# Patient Record
Sex: Female | Born: 1960
Health system: Southern US, Community
[De-identification: ages and names within clinical notes are randomized; demographics above are authoritative.]

## PROBLEM LIST (undated history)

## (undated) DIAGNOSIS — K5090 Crohn's disease, unspecified, without complications: Secondary | ICD-10-CM

## (undated) DIAGNOSIS — K219 Gastro-esophageal reflux disease without esophagitis: Secondary | ICD-10-CM

## (undated) DIAGNOSIS — E785 Hyperlipidemia, unspecified: Secondary | ICD-10-CM

## (undated) DIAGNOSIS — D689 Coagulation defect, unspecified: Secondary | ICD-10-CM

## (undated) DIAGNOSIS — T7840XA Allergy, unspecified, initial encounter: Secondary | ICD-10-CM

## (undated) DIAGNOSIS — F32A Depression, unspecified: Secondary | ICD-10-CM

## (undated) DIAGNOSIS — F419 Anxiety disorder, unspecified: Secondary | ICD-10-CM

## (undated) DIAGNOSIS — E119 Type 2 diabetes mellitus without complications: Secondary | ICD-10-CM

## (undated) DIAGNOSIS — F329 Major depressive disorder, single episode, unspecified: Secondary | ICD-10-CM

## (undated) DIAGNOSIS — K5010 Crohn's disease of large intestine without complications: Secondary | ICD-10-CM

## (undated) DIAGNOSIS — I10 Essential (primary) hypertension: Secondary | ICD-10-CM

## (undated) DIAGNOSIS — K589 Irritable bowel syndrome without diarrhea: Secondary | ICD-10-CM

## (undated) DIAGNOSIS — N739 Female pelvic inflammatory disease, unspecified: Secondary | ICD-10-CM

## (undated) DIAGNOSIS — D649 Anemia, unspecified: Secondary | ICD-10-CM

## (undated) HISTORY — DX: Type 2 diabetes mellitus without complications: E11.9

## (undated) HISTORY — DX: Crohn's disease, unspecified, without complications: K50.90

## (undated) HISTORY — PX: TUBAL LIGATION: SHX77

## (undated) HISTORY — DX: Essential (primary) hypertension: I10

## (undated) HISTORY — DX: Major depressive disorder, single episode, unspecified: F32.9

## (undated) HISTORY — DX: Allergy, unspecified, initial encounter: T78.40XA

## (undated) HISTORY — DX: Anemia, unspecified: D64.9

## (undated) HISTORY — DX: Irritable bowel syndrome, unspecified: K58.9

## (undated) HISTORY — DX: Anxiety disorder, unspecified: F41.9

## (undated) HISTORY — DX: Hyperlipidemia, unspecified: E78.5

## (undated) HISTORY — PX: EYE SURGERY: SHX253

## (undated) HISTORY — DX: Female pelvic inflammatory disease, unspecified: N73.9

## (undated) HISTORY — DX: Gastro-esophageal reflux disease without esophagitis: K21.9

## (undated) HISTORY — DX: Depression, unspecified: F32.A

## (undated) HISTORY — DX: Crohn's disease of large intestine without complications: K50.10

## (undated) HISTORY — DX: Coagulation defect, unspecified: D68.9

---

## 1987-08-24 DIAGNOSIS — N739 Female pelvic inflammatory disease, unspecified: Secondary | ICD-10-CM

## 1987-08-24 HISTORY — DX: Female pelvic inflammatory disease, unspecified: N73.9

## 1990-08-23 HISTORY — PX: TONSILLECTOMY: SHX5217

## 1996-08-23 HISTORY — PX: NASAL SINUS SURGERY: SHX719

## 1998-05-20 ENCOUNTER — Other Ambulatory Visit: Admission: RE | Admit: 1998-05-20 | Discharge: 1998-05-20 | Payer: Self-pay | Admitting: Obstetrics and Gynecology

## 1999-05-19 ENCOUNTER — Other Ambulatory Visit: Admission: RE | Admit: 1999-05-19 | Discharge: 1999-05-19 | Payer: Self-pay | Admitting: Obstetrics and Gynecology

## 1999-05-29 ENCOUNTER — Encounter: Payer: Self-pay | Admitting: Obstetrics and Gynecology

## 1999-05-29 ENCOUNTER — Ambulatory Visit (HOSPITAL_COMMUNITY): Admission: RE | Admit: 1999-05-29 | Discharge: 1999-05-29 | Payer: Self-pay | Admitting: Obstetrics and Gynecology

## 2000-05-25 ENCOUNTER — Other Ambulatory Visit: Admission: RE | Admit: 2000-05-25 | Discharge: 2000-05-25 | Payer: Self-pay | Admitting: Obstetrics and Gynecology

## 2001-03-06 ENCOUNTER — Encounter: Payer: Self-pay | Admitting: Obstetrics and Gynecology

## 2001-03-06 ENCOUNTER — Ambulatory Visit (HOSPITAL_COMMUNITY): Admission: RE | Admit: 2001-03-06 | Discharge: 2001-03-06 | Payer: Self-pay | Admitting: Obstetrics and Gynecology

## 2001-06-14 ENCOUNTER — Other Ambulatory Visit: Admission: RE | Admit: 2001-06-14 | Discharge: 2001-06-14 | Payer: Self-pay | Admitting: Obstetrics and Gynecology

## 2002-06-22 ENCOUNTER — Ambulatory Visit (HOSPITAL_COMMUNITY): Admission: RE | Admit: 2002-06-22 | Discharge: 2002-06-22 | Payer: Self-pay | Admitting: Obstetrics and Gynecology

## 2002-06-22 ENCOUNTER — Other Ambulatory Visit: Admission: RE | Admit: 2002-06-22 | Discharge: 2002-06-22 | Payer: Self-pay | Admitting: Obstetrics and Gynecology

## 2002-06-22 ENCOUNTER — Encounter: Payer: Self-pay | Admitting: Obstetrics and Gynecology

## 2002-06-23 HISTORY — PX: BREAST BIOPSY: SHX20

## 2002-06-26 ENCOUNTER — Encounter: Admission: RE | Admit: 2002-06-26 | Discharge: 2002-06-26 | Payer: Self-pay | Admitting: Obstetrics and Gynecology

## 2002-06-26 ENCOUNTER — Encounter (INDEPENDENT_AMBULATORY_CARE_PROVIDER_SITE_OTHER): Payer: Self-pay | Admitting: Specialist

## 2002-06-26 ENCOUNTER — Encounter: Payer: Self-pay | Admitting: Obstetrics and Gynecology

## 2002-08-23 HISTORY — PX: CAROTID ARTERY - SUBCLAVIAN ARTERY BYPASS GRAFT: SUR178

## 2003-06-24 ENCOUNTER — Ambulatory Visit (HOSPITAL_COMMUNITY): Admission: RE | Admit: 2003-06-24 | Discharge: 2003-06-24 | Payer: Self-pay | Admitting: Obstetrics and Gynecology

## 2003-06-24 ENCOUNTER — Other Ambulatory Visit: Admission: RE | Admit: 2003-06-24 | Discharge: 2003-06-24 | Payer: Self-pay | Admitting: Obstetrics and Gynecology

## 2003-12-20 ENCOUNTER — Encounter: Admission: RE | Admit: 2003-12-20 | Discharge: 2003-12-20 | Payer: Self-pay | Admitting: Cardiology

## 2004-01-01 ENCOUNTER — Ambulatory Visit (HOSPITAL_COMMUNITY): Admission: RE | Admit: 2004-01-01 | Discharge: 2004-01-02 | Payer: Self-pay | Admitting: Cardiology

## 2004-01-15 ENCOUNTER — Inpatient Hospital Stay (HOSPITAL_COMMUNITY): Admission: RE | Admit: 2004-01-15 | Discharge: 2004-01-16 | Payer: Self-pay | Admitting: *Deleted

## 2004-07-20 ENCOUNTER — Other Ambulatory Visit: Admission: RE | Admit: 2004-07-20 | Discharge: 2004-07-20 | Payer: Self-pay | Admitting: Obstetrics and Gynecology

## 2004-08-10 ENCOUNTER — Ambulatory Visit (HOSPITAL_COMMUNITY): Admission: RE | Admit: 2004-08-10 | Discharge: 2004-08-10 | Payer: Self-pay | Admitting: Obstetrics and Gynecology

## 2005-03-04 ENCOUNTER — Ambulatory Visit: Payer: Self-pay | Admitting: Cardiology

## 2005-07-21 ENCOUNTER — Other Ambulatory Visit: Admission: RE | Admit: 2005-07-21 | Discharge: 2005-07-21 | Payer: Self-pay | Admitting: Obstetrics and Gynecology

## 2005-09-07 ENCOUNTER — Ambulatory Visit: Admission: RE | Admit: 2005-09-07 | Discharge: 2005-09-07 | Payer: Self-pay | Admitting: Gynecologic Oncology

## 2005-10-20 ENCOUNTER — Ambulatory Visit (HOSPITAL_COMMUNITY): Admission: RE | Admit: 2005-10-20 | Discharge: 2005-10-20 | Payer: Self-pay | Admitting: Obstetrics and Gynecology

## 2005-11-08 ENCOUNTER — Encounter: Admission: RE | Admit: 2005-11-08 | Discharge: 2005-11-08 | Payer: Self-pay | Admitting: Obstetrics and Gynecology

## 2006-07-22 ENCOUNTER — Other Ambulatory Visit: Admission: RE | Admit: 2006-07-22 | Discharge: 2006-07-22 | Payer: Self-pay | Admitting: Obstetrics and Gynecology

## 2007-03-28 ENCOUNTER — Ambulatory Visit (HOSPITAL_COMMUNITY): Admission: RE | Admit: 2007-03-28 | Discharge: 2007-03-28 | Payer: Self-pay | Admitting: Obstetrics and Gynecology

## 2007-07-25 ENCOUNTER — Other Ambulatory Visit: Admission: RE | Admit: 2007-07-25 | Discharge: 2007-07-25 | Payer: Self-pay | Admitting: Obstetrics and Gynecology

## 2008-07-26 ENCOUNTER — Other Ambulatory Visit: Admission: RE | Admit: 2008-07-26 | Discharge: 2008-07-26 | Payer: Self-pay | Admitting: Obstetrics and Gynecology

## 2008-07-26 ENCOUNTER — Encounter: Admission: RE | Admit: 2008-07-26 | Discharge: 2008-07-26 | Payer: Self-pay | Admitting: Obstetrics and Gynecology

## 2009-08-01 ENCOUNTER — Ambulatory Visit (HOSPITAL_COMMUNITY): Admission: RE | Admit: 2009-08-01 | Discharge: 2009-08-01 | Payer: Self-pay | Admitting: Obstetrics and Gynecology

## 2011-01-08 NOTE — Discharge Summary (Signed)
NAMERUNELL, KOVICH                         ACCOUNT NO.:  [id number]   MEDICAL RECORD NO.:  [id number]                   PATIENT TYPE:  INP   LOCATION:  1829                                 FACILITY:  [facility]   PHYSICIAN:  Dorothea Glassman, M.D.                 DATE OF BIRTH:  May 05, 1961   DATE OF ADMISSION:  01/15/2004  DATE OF DISCHARGE:  01/16/2004                                 DISCHARGE SUMMARY   ADMISSION DIAGNOSIS:  Severe proximal right subclavian artery stenosis with  steal syndrome.   PAST MEDICAL HISTORY:  1. Hypertension.  2. Hyperlipidemia.  3. Gastroesophageal reflux disease.  4. History of tobacco use.   ALLERGIES:  The patient is allergic to QUINOLONE antibiotic.   DISCHARGE DIAGNOSIS:  Severe proximal right subclavian artery stenosis with  steal syndrome, status post right carotid subclavian bypass.   BRIEF HISTORY:  Ms. Desrosiers is a 50 year old female with symptoms of right  arm claudication and intermittent dizziness and presyncope.  She was  initially seen and evaluated by Dr. Sabino Snipes.  He performed  arteriography on Jan 01, 2004.  She was found to have a 90% right subclavian  stenosis that was not felt to be amenable to percutaneous intervention.  Consultation with vascular surgery was requested and she was evaluated by  Dr. Amedeo Plenty.  He reviewed the films in the hospital and then saw her in the  office on May 16.  After examination of the patient and review of all of the  medical records, Dr. Amedeo Plenty recommended proceeding with a right carotid  subclavian bypass.  The procedure, risks, and benefits were all discussed  with Ms. Sherryll Burger, and she agreed to proceed with surgery.   HOSPITAL COURSE:  On Jan 15, 2004 Ms. Weberg was electively admitted to  [facility] under the care of Dr. Gordy Clement.  She underwent  the following procedure, right carotid to subclavian bypass with 6-mm,  Dacron, Hemashield graft.  She tolerated the procedure well  transferring in  stable condition to the PACU.  She was extubated immediately following  surgery, awoke from anesthesia neurologically intact.  She was also noted to  have a 2+ right radial pulse.  She remained hemodynamically stable in the  immediate postoperative period.   On May 26, postoperative day 1 Ms. Madry reports feeling fairly well.  Her  vital signs are stable with blood pressure 130/55; she is afebrile; room air  saturation is 96% on room air.  Her heart is in sinus rhythm at  approximately 100 beats per minute.  She does have a few crackles in the  left base.  Her lungs are otherwise clear.  She has tolerated a regular diet  this morning without nausea.  She is voiding freely after removal of her  Foley catheter.  She has ambulated in the hallway with minimal assistance.  Smoking cessation has been discussed with  Ms. Ruotolo and she has also been  seen by the smoking cessation council.  She has been advised that if she is  experiencing withdrawal symptoms from cigarettes she may use nicotine  patches that are available over-the-counter.   LABORATORY STUDIES:  May 26:  CBC white blood cells 15.0, hemoglobin 9.5,  hematocrit 28.7, platelets 466.  Chemistries include a sodium at 135,  potassium 3.7, BUN 3, creatinine 0.6, glucose 108.   Ms. Roat is ready for discharge home on Jan 16, 2004.   CONDITION ON DISCHARGE:  Improved.   DISCHARGE MEDICATIONS:  She is to resume her home medications of  1. Vytorin 10/40 mg daily.  2. Aspirin 325 mg daily.  3. Nexium 40 mg daily.  4. Prozac 20 mg daily.  5. Zyrtec D 1 tab b.i.d.  6. Albuterol inhaler p.r.n.  7. For pain management she may have Tylox 1-2 p.o. q.4h. p.r.n.   INSTRUCTIONS ON DISCHARGE:  1. Activity:  She has been asked to refrain from any driving or any heavy     lifting at this time.  2. Diet:  She is to continue a heart healthy diet.  3. Wound Care:  She is to clean her incisions daily.  4. She may shower  beginning Friday, May 27.   FOLLOW UP:  Dr. Amedeo Plenty would like to see her back at the [facility] office on  Monday, June 13 at 11:40 in the morning.      Mardene Celeste, N.P.                  Dorothea Glassman, M.D.    CTK/MEDQ  D:  01/16/2004  T:  01/17/2004  Job:  [id number]   cc:   Dorothea Glassman, M.D.  [address]  [place]  [place] [address]   Ethelle Lyon, M.D. [facility]  [address] [place]  [place] [address]

## 2011-01-08 NOTE — Consult Note (Signed)
NAMEMORNINGSTAR, TOFT               ACCOUNT NO.:  [id number]   MEDICAL RECORD NO.:  [id number]          PATIENT TYPE:  OUT   LOCATION:  GYN                          FACILITY:  [facility]   PHYSICIAN:  Paola A. Alycia Rossetti, MD    DATE OF BIRTH:  June 20, 1961   DATE OF CONSULTATION:  09/07/2005  DATE OF DISCHARGE:                                   CONSULTATION   REQUESTING PHYSICIAN:  Dr. Lubertha South. Romine.   Ms. Thrush is a very pleasant 50 year old gravida 2, para 1-1-0-2 who has  menorrhagia.  Her cycles are regular but heavy.  Her last cycle was August 18, 2005.  She routinely sees Dr. Joan Flores for GYN care.  She went to Dr.  Joan Flores with the symptoms of menorrhagia and on August 11, 2005 underwent a  vaginal ultrasound.  It revealed the uterus to be 8.9 x 3.6 x 4.5 cm  retroverted with an endometrial stripe of 9.6 mm.  Within the adnexa she had  a 2.6 x 2.8 x 2.4 cm complex cyst with thick septation on the right adnexa  which overall measured 4.1 x 2.9 x 2.6 cm.  The left adnexa was  unremarkable.  There was no free fluid in the cul-de-sac.  Endometrial  biopsy was performed by Dr. Joan Flores which revealed a benign secretory  endometrium with no hyperplasia or carcinoma.  CA-125 was 12.  It is for  this reason that she comes in today.  She, otherwise, is completely  asymptomatic.  She occasionally suffers from some hot flashes at night.  This cyst has been an incidental finding.  She has no other symptoms of  pain.  She denies any change in bowel or bladder habits, any early satiety,  any bloating, any fevers or chills.   PAST MEDICAL HISTORY:  Significant for fibromuscular dysplasia.   PAST SURGICAL HISTORY:  Bypassed right subclavian in 2005, per her report,  otherwise, negative catheterizations.  She had sinus surgeries.  Cesarean  section for her twins 15 years ago.  Tubal ligation.   ALLERGIES:  None.   MEDICATIONS:  Zyrtec.  Lisinopril.  Vytorin.  Aspirin. Prozac.   SOCIAL HISTORY:   She denies history of alcohol.  She is divorced.  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   FAMILY HISTORY:  Significant for hypertension and hypercholesterolemia  through her family.  Her mother had colon cancer at the age of 20.   HEALTH MAINTENANCE:  She had a negative Pap smear July 21, 2005.  She is  due for a mammogram.   PHYSICAL EXAMINATION:  VITAL SIGNS:  Weight 162 pounds, height 5 feet 5  inches, blood pressure 140/90, pulse 80, respirations 18.  GENERAL:  Well-nourished, well-developed female in no acute distress.  NECK:  She has a well-healed surgical incision on the right side.  Neck is  supple.  There is no lymphadenopathy, no thyromegaly.  LUNGS:  Clear to auscultation bilaterally.  CARDIOVASCULAR:  Regular rate and rhythm.  ABDOMEN:  Notable for an infraumbilical incision.  Abdomen is soft,  nontender and nondistended.  There are no  palpable masses or  hepatosplenomegaly.  Groins are negative for adenopathy.  EXTREMITIES:  There is no edema.  PELVIC:  External genitalia is within normal limits.  Bimanual examination  the corpus is of normal size, shape and consistency.  It is retroverted.  There are no adnexal masses.   ASSESSMENT:  A 50 year old with a 2-cm complex right adnexal mass, normal CA-  125.  I discussed with her that the etiology of this could be multifaceted  including benign as well as neoplastic processes.  My suspicion is that this  represents a benign process; however, I cannot be 100% sure.  I think that  this may represent a collapsed ovarian cyst, and it would not be  unreasonable to follow this closely considering  her past medical history.  She is amenable to close followup.  We will ask her to get a followup  ultrasound in Dr. Julieta Bellini office in 6-8 weeks.  If the cyst is smaller at  that time conservative management for her menorrhagia can be proceeded under  the care of Dr. Joan Flores.  Should the mass be larger or there be other more   worrisome features, we may need to consider laparoscopic evaluation.  At  that time consideration could be given to endometrial ablation which is  something patient states she and Dr. Joan Flores have discussed for some time in  the past.   PLAN:  She will get an ultrasound in Dr. Julieta Bellini office in 6-8 weeks for  comparison.  We will ask Dr. Joan Flores to forward the results to [place] so that  disposition can be made.      Paola A. Alycia Rossetti, MD  Electronically Signed     PAG/MEDQ  D:  09/07/2005  T:  09/07/2005  Job:  [id number]   cc:   Lubertha South. Romine, M.D.  Fax: [phone]   Caswell Corwin, R.N.  [address], [place] [address]   Carlus Pavlov, MD  [place], [place]   Mingo Amber, MD   Marijo Conception. Wall, M.D.  [address] [place]  [place] [address]

## 2011-01-08 NOTE — Op Note (Signed)
[name], [name]                         ACCOUNT NO.:  [id number]   MEDICAL RECORD NO.:  [id number]                   PATIENT TYPE:  INP   LOCATION:  2330                                 FACILITY:  [facility]   PHYSICIAN:  Dorothea Glassman, M.D.                 DATE OF BIRTH:  April 27, 1961   DATE OF PROCEDURE:  01/15/2004  DATE OF DISCHARGE:                                 OPERATIVE REPORT   SURGEON:  Dorothea Glassman, M.D.   ASSISTANT:  Mardene Celeste, N.P.   ANESTHESIA:  General endotracheal anesthesia.   ANESTHESIOLOGIST:  Nelda Severe. Tobias Alexander, M.D.   PREOPERATIVE DIAGNOSIS:  Severe proximal right subclavian artery stenosis  with steal.   POSTOPERATIVE DIAGNOSIS:  Severe proximal right subclavian artery stenosis  with steal.   PROCEDURE:  Right carotid subclavian bypass with 6 mm Dacron Hemashield  graft.   CLINICAL NOTE:  Catherine Nicholson is a 50 year old female who developed symptoms  of right arm claudication and intermittent dizziness and presyncope.  She  was seen and evaluated by Dr. Sabino Snipes with arteriography indicative  of a severe proximal right subclavian artery stenosis.  She did have  subclavian steal.  This lesion was not felt to be amenable to percutaneous  treatment and, therefore, she is referred for right carotid subclavian  bypass for relief of symptoms.  The risks of this operative procedure with  the major morbidity and mortality of 1-2% to include but not limited to MI,  CVA, nerve injury, and death were discussed.   OPERATIVE PROCEDURE:  The patient was brought to the operating room in  stable condition.  She was placed in the supine position.  General  endotracheal anesthesia was induced.  A Foley catheter and arterial line  were placed.  The right neck was prepped and draped in a sterile fashion.  A  transverse skin incision was made at the base of the right neck and  supraclavicular fossa.  Dissection was carried deeply through the  subcutaneous  tissue.  The clavicular head of the sternocleidomastoid muscle  was divided.  The supraclavicular fat pad was mobilized and reflected  superiorly.  The right anterior jugular vein was ligated with 3-0 silk and  divided.  The right internal jugular vein was mobilized and reflected  medially.  The vagus nerve was freed and reflected posteriorly.  The  proximal right common carotid artery was freed and encircled with a vessel  loop.  This was free of significant disease.  Attention was turned to the  subclavian artery.  The phrenic nerve was mobilized and reflected medially.  The anterior scalene muscle was divided.  The subclavian artery was exposed  from the take off of the right internal mammary artery distally.  This was  encircled with a vessel loop and mobilized.  The patient was administered  7000 units of heparin intravenously.  The right common carotid artery was  controlled with clamps.  A longitudinal arteriotomy was made.  The 6 mm  graft was beveled and anastomosed end-to-side to the right common carotid  artery with running 6-0 Prolene suture.  Antegrade and retrograde flushing  of the carotid was carried out.  The clamp was removed from the carotid and  the clamp placed on the graft.  The graft was then tunneled anterior to the  vagus nerve and posterior to the jugular vein.  The right subclavian artery  was controlled proximally and distally with clamps.  A longitudinal  arteriotomy was made.  The graft was beveled and anastomosed end-to-side to  the right subclavian artery using a running 6-0 Prolene suture.  At the  completion of this, the graft and artery were flushed.  The clamps were  removed and excellent flow was present.  The patient was administered 50 mg  protamine intravenously.  Adequate hemostasis was obtained.  Sponge and  instrument counts were correct.  The wound was irrigated with saline  solution.  The clavicular head of the sternocleidomastoid muscle was   reapproximated with interrupted 2-0 Vicryl suture.  The platysma was closed  with running 3-0 Vicryl suture.  The skin was closed with 4-0 Monocryl.  Steri-Strips were applied.  There were no apparent complications.  Anesthesia was reversed.  The patient awakened readily.  2+ right radial  pulse present.  Neurologically intact.  She transferred to the recovery room  in stable condition, no apparent interoperative complications.                                               Dorothea Glassman, M.D.    PGH/MEDQ  D:  01/15/2004  T:  01/15/2004  Job:  [id number]   cc:   Ethelle Lyon, M.D. [facility]  [address] [place]  [place] [address]

## 2011-01-08 NOTE — Cardiovascular Report (Signed)
Catherine Nicholson, Catherine Nicholson                         ACCOUNT NO.:  [id number]   MEDICAL RECORD NO.:  [id number]                   PATIENT TYPE:  OIB   LOCATION:  5731                                 FACILITY:  [facility]   PHYSICIAN:  Ethelle Lyon, M.D. [facility]         DATE OF BIRTH:  1961/07/17   DATE OF PROCEDURE:  01/01/2004  DATE OF DISCHARGE:                              CARDIAC CATHETERIZATION   PROCEDURE:  Bilateral lower extremity angiography with runoff to the feet,  abdominal aortography, arch aortography, selective innominate angiography  via common femoral access, selective subclavian angiography also via right  brachial access.   INDICATION:  Catherine Nicholson is a 50 year old lady with risk factors of  hypertension and dyslipidemia and ongoing tobacco abuse as well as family  history of premature atherosclerotic disease.  She presents with two years  of progressive claudication in her right arm.  She also develops profound  lightheadedness with mild to moderate activity using her right arm.  She has  not had any frank syncope.  In addition, she complains of bilateral calf  cramping with walking approximately 1/2 mile.  She has a 50-mm differential  in systolic blood pressures from left to right arm with the right being  lower.  Ultrasound demonstrated right subclavian stenosis with retrograde  flow in the vertebral.  Flow in the left vertebral is antegrade.  She is  brought to the lab for angiography with an eye to percutaneous  revascularization.   PROCEDURAL TECHNIQUE:  Informed consent was obtained.  Under 1% lidocaine  local anesthesia, a 6 French sheath was placed in the right brachial artery  using the modified Seldinger technique.  Anticoagulation was initiated with  5000 units of heparin.  An end-hole catheter was advanced over a wire into  the right subclavian artery.  Angiography was performed by hand injection.  It was difficult to visualize the stenosis due to inability to  pass  sufficient contrast retrograde through the stenosis.  Therefore, access was  obtained in the right common femoral artery.  Using the modified Seldinger  technique, a 6 French sheath was placed in the right common femoral artery.  A pigtail catheter was advanced into the ascending aorta.  Arch angiography  was performed by power injection.  A 5 [place] headhunter catheter was then  used to selectively engage the innominate artery.  Angiography was performed  in multiple projections by hand injection.  These images demonstrated a 90%  stenosis of the right subclavian artery beginning flush with the distal  margin of the right carotid and with ulcerated plaque extending distal to  the takeoff of the vertebral.  Decision was made to consider surgical  revascularization.   Given her classic symptoms for claudication, abdominal aortography with  bilateral lower extremity runoff was then performed. The pigtail catheter  was reintroduced and positioned in the suprarenal abdominal aorta.  Aortography was performed by power injection.  The catheter was then  pulled  back to the infrarenal abdominal aorta.  Aortography and imaging of both  lower extremities by step table technique using digital subtraction was  performed.  Catheter was then removed.  She was transferred to the holding  room in stable condition.  Sheaths will be removed when the ACT is less than  175 seconds.   COMPLICATIONS:  None.   FINDINGS:  1. Normal aortic arch with normal origins of the great vessels.  2. 90% right subclavian stenosis extending from the distal margin of the     right carotid with ulcerated plaque extending through the take off of the     right vertebral artery which is large.  3. Normal abdominal aorta.  There are single renal arteries bilaterally.     The left renal is normal.  The right has fibromuscular dysplasia in its     mid segment.  4. Bilateral lower extremities are normal with three-vessel  runoff to the     feet bilaterally.   IMPRESSION/RECOMMENDATIONS:  1. 90% right subclavian stenosis.  There is no safe landing zone for     stenting.  I have therefore reviewed the films with Dr. Amedeo Plenty who will     plan on surgical bypass.  Plavix will be discontinued in anticipation of     this procedure.  2. Right renal artery fibromuscular dysplasia.  Will plan percutaneous     therapy after subclavian revascularization.  3. Normal lower extremity vascular system.                                               Ethelle Lyon, M.D. [facility]    WED/MEDQ  D:  01/01/2004  T:  01/02/2004  Job:  [id number]   cc:   Dorothea Glassman, M.D.  [address]  [place]  [place] [address]   Thomas C. Wall, M.D.   Dr. Carlus Pavlov in [place]

## 2011-01-08 NOTE — Discharge Summary (Signed)
Catherine Nicholson, Catherine Nicholson                         ACCOUNT NO.:  [id number]   MEDICAL RECORD NO.:  [id number]                   PATIENT TYPE:  OIB   LOCATION:                                       FACILITY:  [facility]   PHYSICIAN:  Ethelle Lyon, M.D. [facility]         DATE OF BIRTH:  04/30/1961   DATE OF ADMISSION:  01/01/2004  DATE OF DISCHARGE:  01/02/2004                                 DISCHARGE SUMMARY   BRIEF HISTORY:  This is a 50 year old female admitted to [facility]  on Jan 01, 2004 by Dr. Albertine Patricia for evaluation of a high-grade proximal right  subclavian artery stenosis, with subclavian steel symptoms. The patient has  multiple risk factors for vascular disease including positive family history  of early coronary artery disease, hyperlipidemia, tobacco use and obesity  (please see Admission History and Physical for full details).   PAST MEDICAL HISTORY:  1. Gastroesophageal reflux disease.  2. Hypertension.  3. Elevated lipids.  4. History of tobacco use.   ALLERGIES:  The patient is allergic to QUINOLONE ANTIBIOTICS.   SOCIAL HISTORY:  The patient smokes a pack a day. She does not drink  alcohol. She does drink a fair amount of caffeinated beverages. She tries to  walk approximately three times a week, 30 minutes a day.   HOSPITAL COURSE:  As noted, this patient was admitted to [facility]  for further evaluation of peripheral vascular disease. She did have an  angiogram performed on Jan 01, 2004 by Dr. Albertine Patricia. She was found to have a  90% right subclavian stenosis (please see his dictated report for full  details). Was not felt that this lesion was amenable to percutaneous  intervention, and Dr. Drucie Opitz was asked to review the films for possible  bypass surgery. It was felt that the patient would need an angioplasty of  her right renal artery at some point in the future.   Following the procedure, the patient did develop a right arm hematoma. She  is kept  overnight for observation.   The patient was discharged on the following day. She is to follow up in the  office with Dr. Drucie Opitz. The office will call her for an appointment. She  was taken off her Plavix for the impending surgery. She was discharged in  stable, improved condition.   LABORATORY DATA:  A chemistry profile revealed BUN of 3, creatinine 0.7,  potassium 3.9, glucose 104. There were no other labs performed.   DISCHARGE MEDICATIONS:  The patient was told to continue her home  medications except for her Plavix. Other medications included:  1. Aspirin 81 mg daily.  2. Zyrtec.  3. Prozac dosage unknown.  4. Vytorin 10/12 daily.  5. Albuterol inhalers.   The patient was told to take Tylenol as needed for pain. She was told to  avoid any strenuous activity or driving for two days. She was to  be on a low-  salt, low-fat diet. She was to call the office if she had any increased  pain, swelling or bleeding from her groin. She was told not to resume  smoking.   She was to follow up with Dr. Amedeo Plenty as instructed, Dr. Albertine Patricia on February 28, 2004 at 11 a.m. on Friday, Dr. Lysbeth Galas as needed or as scheduled. She will  see Dr. Verl Blalock, her primary cardiologist, following further evaluation by Dr.  Albertine Patricia.   PROBLEM LIST AT THE TIME OF DISCHARGE:  1. Right subclavian artery stenosis by angiogram, performed on Jan 01, 2004,     with postprocedural hematoma in the right upper extremity.  2. Right renal artery disease with planned angioplasty in the future.  3. History of tobacco use.  4. Peripheral vascular disease.  5. Gastroesophageal reflux disease.  6. Hypertension.  7. History of elevated lipids.      Mikey Bussing, P.A. [facility]                  Ethelle Lyon, M.D. [facility]    DR/MEDQ  D:  01/02/2004  T:  01/02/2004  Job:  [id number]   cc:   Carlus Pavlov, M.D.   Dorothea Glassman, M.D.  [address]  [place]  [place] [address]

## 2011-08-24 DIAGNOSIS — K5010 Crohn's disease of large intestine without complications: Secondary | ICD-10-CM

## 2011-08-24 HISTORY — DX: Crohn's disease of large intestine without complications: K50.10

## 2011-09-06 ENCOUNTER — Other Ambulatory Visit: Payer: Self-pay | Admitting: Obstetrics and Gynecology

## 2011-09-06 DIAGNOSIS — Z1231 Encounter for screening mammogram for malignant neoplasm of breast: Secondary | ICD-10-CM

## 2011-09-14 ENCOUNTER — Encounter: Payer: Self-pay | Admitting: Gastroenterology

## 2011-09-17 ENCOUNTER — Ambulatory Visit (INDEPENDENT_AMBULATORY_CARE_PROVIDER_SITE_OTHER): Payer: BC Managed Care – PPO | Admitting: Gastroenterology

## 2011-09-17 ENCOUNTER — Encounter: Payer: Self-pay | Admitting: Gastroenterology

## 2011-09-17 DIAGNOSIS — R143 Flatulence: Secondary | ICD-10-CM

## 2011-09-17 DIAGNOSIS — Z800 Family history of malignant neoplasm of digestive organs: Secondary | ICD-10-CM | POA: Insufficient documentation

## 2011-09-17 DIAGNOSIS — R141 Gas pain: Secondary | ICD-10-CM

## 2011-09-17 DIAGNOSIS — R140 Abdominal distension (gaseous): Secondary | ICD-10-CM

## 2011-09-17 DIAGNOSIS — D649 Anemia, unspecified: Secondary | ICD-10-CM

## 2011-09-17 MED ORDER — PEG-KCL-NACL-NASULF-NA ASC-C 100 G PO SOLR: 1.0000 | Freq: Once | ORAL | Status: DC

## 2011-09-17 NOTE — Progress Notes (Signed)
History of Present Illness: Ms. Catherine Nicholson is a 51 year old white female referred at the request of Dr. Alease Medina for colorectal cancer screening. Her mother developed colon cancer in her mid to late 57s. The patient has no GI complaints including change in bowel habits, abdominal pain, melena or hematochezia.    Past Medical History  Diagnosis Date  . Hypertension   . Hyperlipidemia    Past Surgical History  Procedure Date  . Carotid artery - subclavian artery bypass graft 2004  . Eye surgery     bi-lat lens transplant  . Tonsillectomy 1992  . Cesarean section 1991   family history includes Colon cancer in her mother; Heart disease in her father; and Hypertension in her father. Current Outpatient Prescriptions  Medication Sig Dispense Refill  . amitriptyline (ELAVIL) 25 MG tablet Take 25 mg by mouth at bedtime.      Marland Kitchen esomeprazole (NEXIUM) 40 MG capsule Take 40 mg by mouth daily before breakfast.      . fish oil-omega-3 fatty acids 1000 MG capsule Take 2 g by mouth 2 (two) times daily.      Marland Kitchen lisinopril (PRINIVIL,ZESTRIL) 40 MG tablet Take 40 mg by mouth daily.      . rosuvastatin (CRESTOR) 40 MG tablet Take 40 mg by mouth daily.       Allergies as of 09/17/2011  . (No Known Allergies)    reports that she quit smoking about 13 months ago. Her smoking use included Cigarettes. She quit after 30 years of use. She has never used smokeless tobacco. She reports that she does not drink alcohol or use illicit drugs.     Review of Systems: She complains of menorrhagia. Pertinent positive and negative review of systems were noted in the above HPI section. All other review of systems were otherwise negative.  Vital signs were reviewed in today's medical record Physical Exam: General: Well developed , well nourished, no acute distress Head: Normocephalic and atraumatic Eyes:  sclerae anicteric, EOMI Ears: Normal auditory acuity Mouth: No deformity or lesions Neck: Supple, no masses or  thyromegaly Lungs: Clear throughout to auscultation Heart: Regular rate and rhythm; no murmurs, rubs or bruits Abdomen: Soft, non tender and non distended. No masses, hepatosplenomegaly or hernias noted. Normal Bowel sounds Rectal:deferred Musculoskeletal: Symmetrical with no gross deformities  Skin: No lesions on visible extremities Pulses:  Normal pulses noted Extremities: No clubbing, cyanosis, edema or deformities noted Neurological: Alert oriented x 4, grossly nonfocal Cervical Nodes:  No significant cervical adenopathy Inguinal Nodes: No significant inguinal adenopathy Psychological:  Alert and cooperative. Normal mood and affect

## 2011-09-17 NOTE — Patient Instructions (Addendum)
You have been scheduled for a colonoscopy. Please follow written instructions given to you at your visit today.  Please pick up your prep kit at the pharmacy within the next 2-3 days.   We have sent the following medications to your pharmacy for you to pick up at your convenience: Moviprep Please follow the instructions you were given today at your appointment.

## 2011-09-17 NOTE — Assessment & Plan Note (Signed)
Plan screening colonoscopy

## 2011-09-20 ENCOUNTER — Encounter: Payer: Self-pay | Admitting: Internal Medicine

## 2011-09-23 ENCOUNTER — Other Ambulatory Visit (INDEPENDENT_AMBULATORY_CARE_PROVIDER_SITE_OTHER): Payer: BC Managed Care – PPO

## 2011-09-23 ENCOUNTER — Other Ambulatory Visit: Payer: Self-pay | Admitting: Gastroenterology

## 2011-09-23 ENCOUNTER — Encounter: Payer: Self-pay | Admitting: Gastroenterology

## 2011-09-23 ENCOUNTER — Ambulatory Visit (AMBULATORY_SURGERY_CENTER): Payer: BC Managed Care – PPO | Admitting: Gastroenterology

## 2011-09-23 DIAGNOSIS — D649 Anemia, unspecified: Secondary | ICD-10-CM

## 2011-09-23 DIAGNOSIS — Z800 Family history of malignant neoplasm of digestive organs: Secondary | ICD-10-CM

## 2011-09-23 DIAGNOSIS — K5289 Other specified noninfective gastroenteritis and colitis: Secondary | ICD-10-CM

## 2011-09-23 DIAGNOSIS — Z1211 Encounter for screening for malignant neoplasm of colon: Secondary | ICD-10-CM

## 2011-09-23 DIAGNOSIS — R143 Flatulence: Secondary | ICD-10-CM

## 2011-09-23 DIAGNOSIS — R141 Gas pain: Secondary | ICD-10-CM

## 2011-09-23 DIAGNOSIS — K529 Noninfective gastroenteritis and colitis, unspecified: Secondary | ICD-10-CM

## 2011-09-23 LAB — CBC WITH DIFFERENTIAL/PLATELET
Basophils Relative: 1.2 % (ref 0.0–3.0)
Eosinophils Absolute: 0.3 10*3/uL (ref 0.0–0.7)
Eosinophils Relative: 3.5 % (ref 0.0–5.0)
HCT: 34.5 % — ABNORMAL LOW (ref 36.0–46.0)
Hemoglobin: 11.5 g/dL — ABNORMAL LOW (ref 12.0–15.0)
Lymphocytes Relative: 24.4 % (ref 12.0–46.0)
MCV: 92.4 fl (ref 78.0–100.0)
Monocytes Relative: 7.8 % (ref 3.0–12.0)
Neutro Abs: 5.4 10*3/uL (ref 1.4–7.7)
Neutrophils Relative %: 63.1 % (ref 43.0–77.0)
RDW: 16 % — ABNORMAL HIGH (ref 11.5–14.6)

## 2011-09-23 LAB — COMPREHENSIVE METABOLIC PANEL
Albumin: 3.4 g/dL — ABNORMAL LOW (ref 3.5–5.2)
BUN: 7 mg/dL (ref 6–23)
Calcium: 8.9 mg/dL (ref 8.4–10.5)
Chloride: 107 mEq/L (ref 96–112)
Creatinine, Ser: 0.7 mg/dL (ref 0.4–1.2)
Glucose, Bld: 94 mg/dL (ref 70–99)
Potassium: 4.7 mEq/L (ref 3.5–5.1)

## 2011-09-23 LAB — HIGH SENSITIVITY CRP: CRP, High Sensitivity: 13.83 mg/L — ABNORMAL HIGH (ref 0.000–5.000)

## 2011-09-23 MED ORDER — HYDROCORTISONE ACETATE 25 MG RE SUPP: 25.0000 mg | Freq: Two times a day (BID) | RECTAL | Status: DC

## 2011-09-23 MED ORDER — MESALAMINE 1.2 G PO TBEC: 4.8000 g | DELAYED_RELEASE_TABLET | Freq: Every day | ORAL | Status: DC

## 2011-09-23 MED ORDER — SODIUM CHLORIDE 0.9 % IV SOLN: 500.0000 mL | INTRAVENOUS | Status: DC

## 2011-09-23 NOTE — Patient Instructions (Signed)
Resume medications.follow up with DR.Kaplan in 3 weeks. Begin lialda 4.8 gm and anusol HC supp. Labs to be drawn upon D/C. D/C Information reviewed with family. Information given on colitis/crohn's.

## 2011-09-23 NOTE — Progress Notes (Signed)
Patient did not experience any of the following events: a burn prior to discharge; a fall within the facility; wrong site/side/patient/procedure/implant event; or a hospital transfer or hospital admission upon discharge from the facility. (G8907) Patient did not have preoperative order for IV antibiotic SSI prophylaxis. (G8918)  

## 2011-09-23 NOTE — Op Note (Signed)
[facility] [address]. [place], [place]  [id number]  COLONOSCOPY PROCEDURE REPORT  PATIENT:  Catherine Nicholson, Catherine Nicholson  MR#:  [id number] BIRTHDATE:  [birth year], 57 yrs. old  GENDER:  female ENDOSCOPIST:  Sandy Salaam. Deatra Ina, MD REF. BY:  Elyse Hsu, M.D. PROCEDURE DATE:  09/23/2011 PROCEDURE:  Colonoscopy with biopsy ASA CLASS:  Class II INDICATIONS:  Elevated Risk Screening, family history of colon cancer mother MEDICATIONS:   MAC sedation, administered by CRNA propofol 229mIV  DESCRIPTION OF PROCEDURE:   After the risks benefits and alternatives of the procedure were thoroughly explained, informed consent was obtained.  Grade 3 internal hemorrhoids The LB [id number] was introduced through the anus and advanced to the cecum, which was identified by the ileocecal valve, without limitations.  The quality of the prep was good, using MoviPrep.  The instrument was then slowly withdrawn as the colon was fully examined. <<PROCEDUREIMAGES>>  FINDINGS:  Colitis was found. Severe colitis with areas of deeply reddened, hemorrhagic mucosa, nodularity, superficial ulcers, and pseudopolyps. There are at least 3 segments of normal mucosa in the distal transverse colon, descending colon, and midsigmoid. Otherwise, inflammatory changes extend from rectum to cecum (see image4, image3, image7, image8, image6, image10, image9, image12, and image13).   Retroflexed views in the rectum revealed no other findings other than those already described.    The time to cecum =  1) 4.0  minutes. The scope was then withdrawn in  1) 8.50 minutes from the cecum and the procedure completed. COMPLICATIONS:  None ENDOSCOPIC IMPRESSION: 1) Colitis - changes are more suggestive of Crohn's colitis than ulcerative colitis 2) hemorrhoids RECOMMENDATIONS: 1) Await biopsy results 2) My office will arrange for you to have labs drawn (CBC and CMET), CRP, IBD serologies, TPMT phenotype 3) Call  office for follow-up appointment in 3 weeks 4) begin lialda 4.8gm po 5) anusol HC supp REPEAT EXAM:  No  ______________________________ RSandy Salaam KDeatra Ina MD  CC:  JCarlus Pavlov MD  n. eLorrin Mais   RSandy Salaam Kaplan at 09/23/2011 10:43 AM  CValentina Lucks [phone]

## 2011-09-24 ENCOUNTER — Other Ambulatory Visit: Payer: Self-pay | Admitting: Gastroenterology

## 2011-09-24 ENCOUNTER — Telehealth: Payer: Self-pay | Admitting: *Deleted

## 2011-09-24 NOTE — Telephone Encounter (Signed)
  Follow up Call-  Call back number 09/23/2011  Post procedure Call Back phone  # [phone]  Permission to leave phone message Yes     Patient questions:  Do you have a fever, pain , or abdominal swelling? no Pain Score  0 *  Have you tolerated food without any problems? yes  Have you been able to return to your normal activities? yes  Do you have any questions about your discharge instructions: Diet   no Medications  no Follow up visit  no  Do you have questions or concerns about your Care? no  Actions: * If pain score is 4 or above: No action needed, pain <4.

## 2011-09-30 ENCOUNTER — Encounter: Payer: Self-pay | Admitting: Gastroenterology

## 2011-10-01 ENCOUNTER — Encounter: Payer: Self-pay | Admitting: *Deleted

## 2011-10-04 ENCOUNTER — Ambulatory Visit (HOSPITAL_COMMUNITY)
Admission: RE | Admit: 2011-10-04 | Discharge: 2011-10-04 | Disposition: A | Discharge status: Home / Self Care | Payer: BC Managed Care – PPO | Source: Ambulatory Visit | Attending: Obstetrics and Gynecology | Admitting: Obstetrics and Gynecology

## 2011-10-04 DIAGNOSIS — Z1231 Encounter for screening mammogram for malignant neoplasm of breast: Secondary | ICD-10-CM

## 2011-10-25 ENCOUNTER — Encounter: Payer: Self-pay | Admitting: Gastroenterology

## 2011-10-25 ENCOUNTER — Ambulatory Visit (INDEPENDENT_AMBULATORY_CARE_PROVIDER_SITE_OTHER): Payer: BC Managed Care – PPO | Admitting: Gastroenterology

## 2011-10-25 DIAGNOSIS — Z800 Family history of malignant neoplasm of digestive organs: Secondary | ICD-10-CM

## 2011-10-25 DIAGNOSIS — K5090 Crohn's disease, unspecified, without complications: Secondary | ICD-10-CM

## 2011-10-25 HISTORY — DX: Crohn's disease, unspecified, without complications: K50.90

## 2011-10-25 MED ORDER — HYOSCYAMINE SULFATE ER 0.375 MG PO TBCR: EXTENDED_RELEASE_TABLET | ORAL | Status: DC

## 2011-10-25 NOTE — Assessment & Plan Note (Addendum)
Although the inflammation is not specific, there are skip areas of the colon suggesting that she has Crohn's colitis. Diarrhea in the face of high-dose lialda may reflect a medication-effect.  Recommendations #1 lower lialda to 2.4 g daily #2 hyomax when necessary abdominal pain

## 2011-10-25 NOTE — Progress Notes (Signed)
History of Present Illness:  Catherine Nicholson has returned following colonoscopy which showed a segmental colitis. Her main complaint is lower abdominal pain. This is unchanged since starting lialda. She has developed diarrhea fairly regularly, however, since starting lialda.  She also complains of profound fatigue.    Review of Systems: Pertinent positive and negative review of systems were noted in the above HPI section. All other review of systems were otherwise negative.    Current Medications, Allergies, Past Medical History, Past Surgical History, Family History and Social History were reviewed in [facility] record  Vital signs were reviewed in today's medical record. Physical Exam: General: Well developed , well nourished, no acute distress

## 2011-10-25 NOTE — Assessment & Plan Note (Signed)
Plan followup colonoscopy 2018 

## 2011-10-25 NOTE — Patient Instructions (Signed)
You will need to schedule a follow up appointment for 1 month

## 2011-11-24 ENCOUNTER — Ambulatory Visit: Payer: BC Managed Care – PPO | Admitting: Gastroenterology

## 2011-11-26 ENCOUNTER — Ambulatory Visit: Payer: BC Managed Care – PPO | Admitting: Gastroenterology

## 2011-12-29 ENCOUNTER — Ambulatory Visit: Payer: BC Managed Care – PPO | Admitting: Gastroenterology

## 2013-02-12 ENCOUNTER — Telehealth: Payer: Self-pay | Admitting: Obstetrics and Gynecology

## 2013-02-12 NOTE — Telephone Encounter (Signed)
Heavy bleeding and patient wants advice please. Patient states she left a message last week and hasn't heard back from [place]. No telephone call note in EPIC.

## 2013-02-12 NOTE — Telephone Encounter (Signed)
Spoke with pt about heavy monthly bleeding. Pt took Lupron last year to control it. Was seen in Jan for Aex and had no bleeding that month. Reports bleeding started back in Feb. Bleeds about 5 days, at times changing her pad every hour at heaviest. Pt is not currently bleeding. Pt has hx of Crohn's and bleeds with that as well, so pt concerned about anemia. Pt aware CR retiring in August and would like to discuss her next step before then. Sched OV with CR 02-21-13 at 2:30. Pt to call back if she needs appt sooner due to heavy bleeding.

## 2013-02-16 ENCOUNTER — Encounter: Payer: Self-pay | Admitting: Obstetrics and Gynecology

## 2013-02-21 ENCOUNTER — Ambulatory Visit (INDEPENDENT_AMBULATORY_CARE_PROVIDER_SITE_OTHER): Payer: BC Managed Care – PPO | Admitting: Obstetrics and Gynecology

## 2013-02-21 VITALS — BP 128/80 | HR 68 | Resp 18 | Ht 65.0 in | Wt 182.0 lb

## 2013-02-21 DIAGNOSIS — N920 Excessive and frequent menstruation with regular cycle: Secondary | ICD-10-CM

## 2013-02-21 DIAGNOSIS — N801 Endometriosis of ovary: Secondary | ICD-10-CM

## 2013-02-21 NOTE — Patient Instructions (Signed)
We will call you to arrange the lupron injections.

## 2013-02-21 NOTE — Progress Notes (Signed)
52 yo SWF G1P1 (twins) with presumed endometriosis, s/p lupron therapy, 6 monthly injections ending in August 2013.  Was amenorrheic until Dec 2013, had a nl menses, then regular menses monthly March, April, May, and June.  Mar, April, and May menses were very very heavy.    Marland Kitchen  Has been having flares of Chron's dz, on 60 mg prednisone daily for the last 4 weeks, and has been on prednisone ever since Jan 2014.  Started Humara 2 months ago.  HGb is quite low, sometimes as low a 6g despite Integra.  Is tired and frequently dizzy.  Had BMD at [place] and was told it was normal.  Daughter Ria Comment got married in a civil ceremony this am.  Pt has abd pain diffusely daily, and doesn't know is the pain is from the presumed endometriosis or from the Chron's.    Discussed with pt therapy options.  She really was leaning towards asking me to do a hysterectomy.  I strongly counseled her against surgery during this flare of her Chron's and with her being on 60 mg prednisone.  She asked even for a laparoscopy just to see if she really has a lot of endo, but I don't want to do laparoscopy now either, due to the ever present possibility of complications.  Pt had a Mirena before and we discussed doing that again, but she was intolerant of it the first time.  Discussed risks of another course of Lupron including bone loss, but at this point, I think that is the least of the "evils" of any of the options.  We certainly will do add back.  Pt agress.  Questions answered.  Over 25 min face to face with pt.

## 2013-02-27 ENCOUNTER — Telehealth: Payer: Self-pay | Admitting: Obstetrics and Gynecology

## 2013-02-27 NOTE — Telephone Encounter (Signed)
Patient started menses on 02-25-13. States she is just letting us know so we can plan Lupron Injection.  Advised that I called Lupron prior [place] abbvie at [phone] and confirmed that Josem Kaufmann is in process and asked to expedite auth since menses has started.  They will let a nurse know in am ([phone] at time of call).

## 2013-02-27 NOTE — Telephone Encounter (Signed)
Patient has started her periods again and they are heavy again and more frequent.

## 2013-03-02 NOTE — Telephone Encounter (Signed)
Pt is returning a call to [employer]

## 2013-03-02 NOTE — Telephone Encounter (Signed)
RC to patient.  She states she is actively bleeding today, light flow.  She is unable to come today, but can come on Monday.  Appt was made for Monday afternoon.  Advised pt that she must be actively bleeding to have injection, and if she stops bleeding she will need to call to let us know and cancel appt.  Pt is agreeable and will call if bleeding stops.

## 2013-03-05 ENCOUNTER — Ambulatory Visit: Payer: BC Managed Care – PPO

## 2013-03-05 NOTE — Telephone Encounter (Signed)
Patient cancelled appointment for Lupron today due to "no period." Patient requests a call back from nurse to reschedule.

## 2013-03-06 ENCOUNTER — Telehealth: Payer: Self-pay | Admitting: *Deleted

## 2013-03-06 NOTE — Telephone Encounter (Signed)
Chart to your office.  I will call them tomorrow to initiate MD/MD review and once I get it initiated, I will give you number to call.

## 2013-03-06 NOTE — Telephone Encounter (Signed)
See next phone note.  Patient does not have insurance coverage for Lupron therapy.  Will not start treatment with sample dose since can not continue full 6 months.  To discuss options with Dr Joan Flores.

## 2013-03-06 NOTE — Telephone Encounter (Signed)
We need to argue about this with the insurance co.  Is there a person at her insurance co I can call and talk to about this?

## 2013-03-06 NOTE — Telephone Encounter (Signed)
Follow up call to patient to notify that we received letter that Lupron coverage has been denied since she has already received two separate  6 month courses of Lupron.  Patient asking what else you suggest.  Are there any other options?  Aware she may need to come in to discuss what options are available.

## 2013-03-07 NOTE — Telephone Encounter (Signed)
I faxed a letter to [facility] [employer] to appeal.

## 2013-03-07 NOTE — Telephone Encounter (Signed)
Patient notified that Dr Joan Flores has written letter to appeal and it was faxed this am.  Will follow up with [employer] on 03-12-13 if no response.

## 2013-03-08 NOTE — Telephone Encounter (Signed)
Re: referral for this patient and calling to make sure we received the benefits information by fax.

## 2013-03-08 NOTE — Telephone Encounter (Signed)
Nurse reviewer Baxter Flattery from [place]  [phone] called to confirm that letter to appeal Lupron that they received was indeed to appeal the decision for Lupaneta that was recently denied. Advised that we are appealing the denial decision on the Lupaneta but all we are concerned about is the Lupron (Lupaneta is Lupron with addition of Aygestin tablets dispensed in a package set).  She will forward our appeal to next step.

## 2013-03-08 NOTE — Telephone Encounter (Signed)
Return call to [place] at [place].  No fax received today.  They are actually calling about benefit summary from 02-28-13.  Advised this was received but prior auth denied and i, appeal process so not yet able to proceed with order.

## 2013-03-13 ENCOUNTER — Telehealth: Payer: Self-pay | Admitting: *Deleted

## 2013-03-13 NOTE — Telephone Encounter (Signed)
Call to Baxter Flattery at [facility] to check status of appeal. Per Baxter Flattery, medical director reviewed case and decided to uphold appeal since patient has received 12 months of therapy. Next step would have to be 2nd level appeal in which MD would have to speak with appeal MD directly.  Will fax documentation of this call to me.

## 2013-03-13 NOTE — Telephone Encounter (Signed)
Just tell me when I can talk to their doctor and I will do so.

## 2013-03-15 NOTE — Telephone Encounter (Signed)
Returning a call to Catherine Nicholson. °

## 2013-03-23 NOTE — Telephone Encounter (Signed)
I called the [facility]p person [facility] twice and have left messages both times with Charlyne Mom.  No call back yet.

## 2013-03-23 NOTE — Telephone Encounter (Signed)
Call was placed to [employer] earlier this week by Joselyn Arrow CMA to begin 2nd level appeal.  Call received from pharmacist Racheal to notify us that she has initiated 2nd level appeal and confirm that clinical info in the letter form Dr Joan Flores is   The clinical we want to submit.  Advise that this is correct but that Dr Joan Flores does want to speak with MD for 2nd level appeal.  Racheal confirms Dr Romine's office hours for next week.

## 2013-03-29 ENCOUNTER — Telehealth: Payer: Self-pay | Admitting: Obstetrics and Gynecology

## 2013-03-29 NOTE — Telephone Encounter (Signed)
Call to patient and notified that appeal has been successful and authorization received.  She has not yet heard from [place] so both she and I will contact [employer] to check on shipment. Advised patient that [employer] will need her permission and any co-pay to ship medication.  Menses due in next few days so will check tomorrow and call us back.

## 2013-03-29 NOTE — Telephone Encounter (Signed)
Amy - the approval came in yesterday.  The paper is in [place] office.  Please see if you can take care of this today.

## 2013-03-29 NOTE — Telephone Encounter (Signed)
Spoke with pt about appeal process. Advised we are trying to appeal at the second level, and that Dr. Joan Flores will have to speak to the appeal MD herself and get a final decision. Advised we would let her know as soon as we can about the decision. Pt appreciative.

## 2013-03-29 NOTE — Telephone Encounter (Signed)
What is the status of the claim

## 2013-03-30 NOTE — Telephone Encounter (Signed)
Call to check on shipment of Lupaneta.  Shaquita states that case will have to be reopened since was closed when case was denied.  Given PA # [id number] C LP.  ( see letter from [place] in scanned documents)  It will take 2-3 days to reopen case and contact patient.  Since cycle due, requested that this be expedited.  Once they contact patient, they will call us to arrange delivery.  Call to patient, VM confirm patient's name.  LM with update.

## 2013-04-02 ENCOUNTER — Telehealth: Payer: Self-pay | Admitting: *Deleted

## 2013-04-02 NOTE — Telephone Encounter (Signed)
Form received from [employer] for patient assistance program.  Call to patient to ask about this, she has not talked with [employer] yet to discuss shipment.  She is not aware of forms for patient assistance. She will contact [employer] and call us back.

## 2013-04-04 NOTE — Telephone Encounter (Signed)
Return call to patient, she has talked with [employer] and we should be expecting shipment.  She has two questions 1) What is the other medication that comes with Lupron?  Advised that this is Aygestin, add-back therapy to help with the menopausal symptoms from Lupron.  It is now packaged with Lupron.  Will need 1 po QD while on Lupron.  2) How many shots will be needed before improvement in symptoms?  Has chron's disease and is trying to el;iminate if this is from endometriosis versus Chrons or both.  Advised will review with MD but each patient responds differently.  If has responded quickly in past, MAY notice a difference quickly.  Often can take 2 weeks before beginning to notice any change from injection.     Any other instruction?

## 2013-04-04 NOTE — Telephone Encounter (Signed)
Returning phone call

## 2013-04-05 NOTE — Telephone Encounter (Signed)
Excellent job, IT consultant.  Re:  Question #2, it will be about 3 weeks before she may notice an improvement in her symptoms.  Don't forget, she needs to start the Lupron on days 1-5 of cycle.

## 2013-04-09 ENCOUNTER — Telehealth: Payer: Self-pay | Admitting: *Deleted

## 2013-04-09 DIAGNOSIS — N809 Endometriosis, unspecified: Secondary | ICD-10-CM

## 2013-04-09 MED ORDER — NORETHINDRONE ACETATE 5 MG PO TABS: 5.0000 mg | ORAL_TABLET | Freq: Every day | ORAL | Status: DC

## 2013-04-09 MED ORDER — LUPRON DEPOT (1-MONTH) 3.75 MG IM KIT: 3.7500 mg | PACK | INTRAMUSCULAR | Status: DC

## 2013-04-09 NOTE — Telephone Encounter (Signed)
LM on home and work number.

## 2013-04-09 NOTE — Telephone Encounter (Signed)
Patient left message on my VM that menses started on 04-06-13 and needs to get Lupron.  Call back to patient, LMTCB.

## 2013-04-09 NOTE — Telephone Encounter (Signed)
Patient scheduled for Lupron injection tomm at 4pm.

## 2013-04-10 ENCOUNTER — Ambulatory Visit (INDEPENDENT_AMBULATORY_CARE_PROVIDER_SITE_OTHER): Payer: BC Managed Care – PPO | Admitting: *Deleted

## 2013-04-10 VITALS — BP 120/60 | HR 70 | Ht 65.0 in | Wt 190.0 lb

## 2013-04-10 DIAGNOSIS — N809 Endometriosis, unspecified: Secondary | ICD-10-CM

## 2013-04-10 MED ORDER — LEUPROLIDE ACETATE 3.75 MG IM KIT
3.7500 mg | PACK | Freq: Once | INTRAMUSCULAR | Status: AC
  Administered 2013-04-10: 3.75 mg via INTRAMUSCULAR

## 2013-04-10 MED ORDER — LEUPROLIDE ACETATE 3.75 MG IM KIT: 3.7500 mg | PACK | INTRAMUSCULAR | Status: DC

## 2013-04-10 NOTE — Progress Notes (Signed)
Patient tolerated Depo Lupron Injection well, Aygestin given.  Aygestin-Lot:  [id number] Exp: 06/2014  (See Documentation for Lupron)  Pt is aware to return in 1 month for next Depo Lupron Injection.

## 2013-04-10 NOTE — Patient Instructions (Signed)
Pt. Is to return for Depo Lupron in 1 month.

## 2013-04-12 ENCOUNTER — Telehealth: Payer: Self-pay | Admitting: Gynecology

## 2013-04-12 NOTE — Telephone Encounter (Signed)
Patient called to ask if taking Aygestin was necessary to her treatment?  She did not tolerate this medication well when she took it in the past.  Discussed that this was to help with the menopausal symptoms that can accompany this medication and if she is able to tolerate those then she may omit Aygestin.  Patient wants to thank Dr Joan Flores for her efforts to get Lupron approved.  She knows it was a lot of extra work but she is already feeling better.  Agree with above?  Will call [employer] about RX.  Had entered it for purpose of documentting and had put not to electronically send.  Will clarify.

## 2013-04-12 NOTE — Telephone Encounter (Addendum)
Prescription was filled for Lupaneta 3.75/.69m kit.  And to patient. Now we have received another prescription for Lupron 3.75 mg. Which was e-scripted and faxed in. What do we do with this script ? Please advise.

## 2013-04-13 NOTE — Telephone Encounter (Signed)
I can't say it is VITALLY important, but it sure might be worth a try again just to be sure it will have the effects that she anticipates.  Her choice.  They're her bones and her hair and nerves.  I understand.

## 2013-04-13 NOTE — Telephone Encounter (Signed)
The aygestin is also to help protect her bones while she is on this anti-estrogen medication.  I'd like her to try it again if she can please.  It won't be the end of the world if she can't take it, but her bones will be better off.

## 2013-04-13 NOTE — Telephone Encounter (Signed)
Spoke with pt about CR advice. Pt would rather not take the aygestin unless CR feels it is vitally important. Pt experienced hair loss and was "a nervous wreck" while on it. Pt says CR was concerned about her bone health with her high doses of prednisone with the aygestin. Pt wanted me to pass along that she has gotten her prednisone dose down to 15 mg a day since starting Humira. It is her goal to stop prednisone eventually. Pt hopes to be able to increase her exercise as she feels able as well. Wanted to give update to CR to see if this would make any difference re: aygestin.

## 2013-05-11 ENCOUNTER — Ambulatory Visit: Payer: BC Managed Care – PPO

## 2013-05-17 ENCOUNTER — Telehealth: Payer: Self-pay | Admitting: Gynecology

## 2013-05-18 ENCOUNTER — Ambulatory Visit: Payer: BC Managed Care – PPO

## 2013-05-23 ENCOUNTER — Telehealth: Payer: Self-pay | Admitting: *Deleted

## 2013-05-23 NOTE — Telephone Encounter (Signed)
Call to patient to follow up on canceled appointment for Lupron injection.  LMTCB.

## 2013-05-25 NOTE — Telephone Encounter (Signed)
2nd call to follow up on missed Lupron injection. LMTCB.

## 2013-05-30 NOTE — Telephone Encounter (Signed)
Pt is returning your call. She is aware that you are back in the office on Tuesday.

## 2013-06-05 NOTE — Telephone Encounter (Signed)
See next phone note for follow up.

## 2013-06-05 NOTE — Telephone Encounter (Signed)
Return call to patient who reports that she has been very sick with Chron's and is now on high dose prednisone.  She is beginning to improve but her other providers do not want her to continue on Lupron while on the steroids.  Patient will call back when Chron flare is controlled and advised should have office visit at that time to discuss plan. Patient agreeable.  Previous patient of Dr Joan Flores, known endometriosis and has had Lupron in past.  Pre-EPIC chart to your office.

## 2013-09-10 NOTE — Telephone Encounter (Signed)
See next phone messages.  Patient declined Lupron injection due to flare of Chrons.  Routing to provider for final review. Patient agreeable to disposition. Will close encounter

## 2013-09-12 NOTE — Telephone Encounter (Signed)
No additional contact from patient. Any additional follow-up needed. Please advise.   If no follow-up needed, please close encounter.

## 2013-09-24 ENCOUNTER — Ambulatory Visit: Payer: Self-pay | Admitting: Obstetrics and Gynecology

## 2013-09-24 ENCOUNTER — Ambulatory Visit: Payer: Self-pay | Admitting: Gynecology

## 2013-11-02 ENCOUNTER — Ambulatory Visit: Payer: Self-pay | Admitting: Gynecology

## 2014-02-11 ENCOUNTER — Other Ambulatory Visit: Payer: Self-pay | Admitting: Gynecology

## 2014-02-11 DIAGNOSIS — Z1231 Encounter for screening mammogram for malignant neoplasm of breast: Secondary | ICD-10-CM

## 2014-02-27 ENCOUNTER — Ambulatory Visit: Payer: BC Managed Care – PPO | Admitting: Gynecology

## 2014-02-27 ENCOUNTER — Ambulatory Visit (HOSPITAL_COMMUNITY): Payer: BC Managed Care – PPO

## 2014-03-18 ENCOUNTER — Ambulatory Visit (HOSPITAL_COMMUNITY)
Admission: RE | Admit: 2014-03-18 | Discharge: 2014-03-18 | Disposition: A | Discharge status: Home / Self Care | Payer: BC Managed Care – PPO | Source: Ambulatory Visit | Attending: Gynecology | Admitting: Gynecology

## 2014-03-18 DIAGNOSIS — Z1231 Encounter for screening mammogram for malignant neoplasm of breast: Secondary | ICD-10-CM

## 2014-03-27 ENCOUNTER — Ambulatory Visit: Payer: BC Managed Care – PPO | Admitting: Gynecology

## 2014-06-24 ENCOUNTER — Encounter: Payer: Self-pay | Admitting: Obstetrics and Gynecology

## 2016-01-02 DIAGNOSIS — J0100 Acute maxillary sinusitis, unspecified: Secondary | ICD-10-CM | POA: Diagnosis not present

## 2016-03-30 DIAGNOSIS — B029 Zoster without complications: Secondary | ICD-10-CM | POA: Diagnosis not present

## 2016-03-30 DIAGNOSIS — R21 Rash and other nonspecific skin eruption: Secondary | ICD-10-CM | POA: Diagnosis not present

## 2016-04-30 DIAGNOSIS — K5090 Crohn's disease, unspecified, without complications: Secondary | ICD-10-CM | POA: Diagnosis not present

## 2016-04-30 DIAGNOSIS — Z800 Family history of malignant neoplasm of digestive organs: Secondary | ICD-10-CM | POA: Diagnosis not present

## 2016-05-01 DIAGNOSIS — J019 Acute sinusitis, unspecified: Secondary | ICD-10-CM | POA: Diagnosis not present

## 2016-05-19 DIAGNOSIS — D3132 Benign neoplasm of left choroid: Secondary | ICD-10-CM | POA: Diagnosis not present

## 2016-06-17 DIAGNOSIS — H40053 Ocular hypertension, bilateral: Secondary | ICD-10-CM | POA: Diagnosis not present

## 2016-06-28 ENCOUNTER — Encounter: Payer: Self-pay | Admitting: Gastroenterology

## 2016-07-08 DIAGNOSIS — J3489 Other specified disorders of nose and nasal sinuses: Secondary | ICD-10-CM | POA: Diagnosis not present

## 2016-07-08 DIAGNOSIS — E785 Hyperlipidemia, unspecified: Secondary | ICD-10-CM | POA: Diagnosis not present

## 2016-07-08 DIAGNOSIS — E1140 Type 2 diabetes mellitus with diabetic neuropathy, unspecified: Secondary | ICD-10-CM | POA: Diagnosis not present

## 2016-07-17 DIAGNOSIS — J0100 Acute maxillary sinusitis, unspecified: Secondary | ICD-10-CM | POA: Diagnosis not present

## 2016-09-16 DIAGNOSIS — K5090 Crohn's disease, unspecified, without complications: Secondary | ICD-10-CM | POA: Diagnosis not present

## 2016-09-16 DIAGNOSIS — E7800 Pure hypercholesterolemia, unspecified: Secondary | ICD-10-CM | POA: Diagnosis not present

## 2016-09-16 DIAGNOSIS — E1140 Type 2 diabetes mellitus with diabetic neuropathy, unspecified: Secondary | ICD-10-CM | POA: Diagnosis not present

## 2016-09-16 DIAGNOSIS — E1169 Type 2 diabetes mellitus with other specified complication: Secondary | ICD-10-CM | POA: Diagnosis not present

## 2016-09-23 DIAGNOSIS — H40053 Ocular hypertension, bilateral: Secondary | ICD-10-CM | POA: Diagnosis not present

## 2016-10-07 DIAGNOSIS — Z800 Family history of malignant neoplasm of digestive organs: Secondary | ICD-10-CM | POA: Diagnosis not present

## 2016-10-07 DIAGNOSIS — K5090 Crohn's disease, unspecified, without complications: Secondary | ICD-10-CM | POA: Diagnosis not present

## 2016-11-15 DIAGNOSIS — K635 Polyp of colon: Secondary | ICD-10-CM | POA: Diagnosis not present

## 2016-11-15 DIAGNOSIS — R197 Diarrhea, unspecified: Secondary | ICD-10-CM | POA: Diagnosis not present

## 2016-11-15 DIAGNOSIS — K5730 Diverticulosis of large intestine without perforation or abscess without bleeding: Secondary | ICD-10-CM | POA: Diagnosis not present

## 2016-11-15 DIAGNOSIS — K529 Noninfective gastroenteritis and colitis, unspecified: Secondary | ICD-10-CM | POA: Diagnosis not present

## 2016-11-15 DIAGNOSIS — K5090 Crohn's disease, unspecified, without complications: Secondary | ICD-10-CM | POA: Diagnosis not present

## 2016-11-15 DIAGNOSIS — K5150 Left sided colitis without complications: Secondary | ICD-10-CM | POA: Diagnosis not present

## 2016-11-15 DIAGNOSIS — Z800 Family history of malignant neoplasm of digestive organs: Secondary | ICD-10-CM | POA: Diagnosis not present

## 2016-11-15 DIAGNOSIS — D123 Benign neoplasm of transverse colon: Secondary | ICD-10-CM | POA: Diagnosis not present

## 2016-11-15 DIAGNOSIS — K5140 Inflammatory polyps of colon without complications: Secondary | ICD-10-CM | POA: Diagnosis not present

## 2016-11-26 DIAGNOSIS — K5090 Crohn's disease, unspecified, without complications: Secondary | ICD-10-CM | POA: Diagnosis not present

## 2016-11-28 DIAGNOSIS — J0100 Acute maxillary sinusitis, unspecified: Secondary | ICD-10-CM | POA: Diagnosis not present

## 2017-01-02 DIAGNOSIS — K5090 Crohn's disease, unspecified, without complications: Secondary | ICD-10-CM | POA: Diagnosis not present

## 2017-01-02 DIAGNOSIS — J0100 Acute maxillary sinusitis, unspecified: Secondary | ICD-10-CM | POA: Diagnosis not present

## 2017-01-07 DIAGNOSIS — S39012S Strain of muscle, fascia and tendon of lower back, sequela: Secondary | ICD-10-CM | POA: Diagnosis not present

## 2017-01-07 DIAGNOSIS — Z6831 Body mass index (BMI) 31.0-31.9, adult: Secondary | ICD-10-CM | POA: Diagnosis not present

## 2017-01-07 DIAGNOSIS — R609 Edema, unspecified: Secondary | ICD-10-CM | POA: Diagnosis not present

## 2017-01-07 DIAGNOSIS — J329 Chronic sinusitis, unspecified: Secondary | ICD-10-CM | POA: Diagnosis not present

## 2017-01-13 DIAGNOSIS — E242 Drug-induced Cushing's syndrome: Secondary | ICD-10-CM | POA: Insufficient documentation

## 2017-01-13 DIAGNOSIS — I771 Stricture of artery: Secondary | ICD-10-CM | POA: Insufficient documentation

## 2017-01-13 DIAGNOSIS — I10 Essential (primary) hypertension: Secondary | ICD-10-CM | POA: Insufficient documentation

## 2017-01-13 DIAGNOSIS — E119 Type 2 diabetes mellitus without complications: Secondary | ICD-10-CM | POA: Insufficient documentation

## 2017-01-13 DIAGNOSIS — K50111 Crohn's disease of large intestine with rectal bleeding: Secondary | ICD-10-CM | POA: Insufficient documentation

## 2017-01-16 DIAGNOSIS — E1165 Type 2 diabetes mellitus with hyperglycemia: Secondary | ICD-10-CM | POA: Diagnosis not present

## 2017-01-16 DIAGNOSIS — M545 Low back pain: Secondary | ICD-10-CM | POA: Diagnosis not present

## 2017-01-18 DIAGNOSIS — M8580 Other specified disorders of bone density and structure, unspecified site: Secondary | ICD-10-CM | POA: Diagnosis not present

## 2017-01-18 DIAGNOSIS — M4850XS Collapsed vertebra, not elsewhere classified, site unspecified, sequela of fracture: Secondary | ICD-10-CM | POA: Diagnosis not present

## 2017-01-25 DIAGNOSIS — S81802D Unspecified open wound, left lower leg, subsequent encounter: Secondary | ICD-10-CM | POA: Diagnosis not present

## 2017-01-25 DIAGNOSIS — K5090 Crohn's disease, unspecified, without complications: Secondary | ICD-10-CM | POA: Diagnosis not present

## 2017-01-25 DIAGNOSIS — E1140 Type 2 diabetes mellitus with diabetic neuropathy, unspecified: Secondary | ICD-10-CM | POA: Diagnosis not present

## 2017-01-25 DIAGNOSIS — S32000D Wedge compression fracture of unspecified lumbar vertebra, subsequent encounter for fracture with routine healing: Secondary | ICD-10-CM | POA: Diagnosis not present

## 2017-02-01 DIAGNOSIS — S32000A Wedge compression fracture of unspecified lumbar vertebra, initial encounter for closed fracture: Secondary | ICD-10-CM | POA: Diagnosis not present

## 2017-02-04 DIAGNOSIS — S32000A Wedge compression fracture of unspecified lumbar vertebra, initial encounter for closed fracture: Secondary | ICD-10-CM | POA: Diagnosis not present

## 2017-02-04 DIAGNOSIS — S32030A Wedge compression fracture of third lumbar vertebra, initial encounter for closed fracture: Secondary | ICD-10-CM | POA: Diagnosis not present

## 2017-02-08 DIAGNOSIS — M48062 Spinal stenosis, lumbar region with neurogenic claudication: Secondary | ICD-10-CM | POA: Diagnosis not present

## 2017-04-07 DIAGNOSIS — M48061 Spinal stenosis, lumbar region without neurogenic claudication: Secondary | ICD-10-CM | POA: Diagnosis not present

## 2017-04-11 DIAGNOSIS — J0100 Acute maxillary sinusitis, unspecified: Secondary | ICD-10-CM | POA: Diagnosis not present

## 2017-04-12 ENCOUNTER — Other Ambulatory Visit: Payer: Self-pay | Admitting: Orthopedic Surgery

## 2017-04-12 DIAGNOSIS — M48061 Spinal stenosis, lumbar region without neurogenic claudication: Secondary | ICD-10-CM

## 2017-04-22 ENCOUNTER — Ambulatory Visit
Admission: RE | Admit: 2017-04-22 | Discharge: 2017-04-22 | Disposition: A | Discharge status: Home / Self Care | Payer: BLUE CROSS/BLUE SHIELD | Source: Ambulatory Visit | Attending: Orthopedic Surgery | Admitting: Orthopedic Surgery

## 2017-04-22 DIAGNOSIS — M48061 Spinal stenosis, lumbar region without neurogenic claudication: Secondary | ICD-10-CM

## 2017-04-22 MED ORDER — METHYLPREDNISOLONE ACETATE 40 MG/ML INJ SUSP (RADIOLOG
120.0000 mg | Freq: Once | INTRAMUSCULAR | Status: AC
  Administered 2017-04-22: 120 mg via EPIDURAL

## 2017-04-22 MED ORDER — IOPAMIDOL (ISOVUE-M 200) INJECTION 41%
1.0000 mL | Freq: Once | INTRAMUSCULAR | Status: AC
  Administered 2017-04-22: 1 mL via EPIDURAL

## 2017-04-22 NOTE — Discharge Instructions (Signed)

## 2017-04-28 DIAGNOSIS — Z6829 Body mass index (BMI) 29.0-29.9, adult: Secondary | ICD-10-CM | POA: Diagnosis not present

## 2017-04-28 DIAGNOSIS — S81802A Unspecified open wound, left lower leg, initial encounter: Secondary | ICD-10-CM | POA: Diagnosis not present

## 2017-04-28 DIAGNOSIS — Z1389 Encounter for screening for other disorder: Secondary | ICD-10-CM | POA: Diagnosis not present

## 2017-04-29 DIAGNOSIS — S81802A Unspecified open wound, left lower leg, initial encounter: Secondary | ICD-10-CM | POA: Diagnosis not present

## 2017-04-29 DIAGNOSIS — Z7984 Long term (current) use of oral hypoglycemic drugs: Secondary | ICD-10-CM | POA: Diagnosis not present

## 2017-04-29 DIAGNOSIS — E11622 Type 2 diabetes mellitus with other skin ulcer: Secondary | ICD-10-CM | POA: Diagnosis not present

## 2017-04-29 DIAGNOSIS — Z87891 Personal history of nicotine dependence: Secondary | ICD-10-CM | POA: Diagnosis not present

## 2017-04-29 DIAGNOSIS — L97822 Non-pressure chronic ulcer of other part of left lower leg with fat layer exposed: Secondary | ICD-10-CM | POA: Diagnosis not present

## 2017-04-29 DIAGNOSIS — I10 Essential (primary) hypertension: Secondary | ICD-10-CM | POA: Diagnosis not present

## 2017-05-04 DIAGNOSIS — L97222 Non-pressure chronic ulcer of left calf with fat layer exposed: Secondary | ICD-10-CM | POA: Diagnosis not present

## 2017-05-04 DIAGNOSIS — E11622 Type 2 diabetes mellitus with other skin ulcer: Secondary | ICD-10-CM | POA: Diagnosis not present

## 2017-05-04 DIAGNOSIS — M48061 Spinal stenosis, lumbar region without neurogenic claudication: Secondary | ICD-10-CM | POA: Diagnosis not present

## 2017-05-04 DIAGNOSIS — L97822 Non-pressure chronic ulcer of other part of left lower leg with fat layer exposed: Secondary | ICD-10-CM | POA: Diagnosis not present

## 2017-05-11 DIAGNOSIS — E11622 Type 2 diabetes mellitus with other skin ulcer: Secondary | ICD-10-CM | POA: Diagnosis not present

## 2017-05-11 DIAGNOSIS — L97222 Non-pressure chronic ulcer of left calf with fat layer exposed: Secondary | ICD-10-CM | POA: Diagnosis not present

## 2017-05-11 DIAGNOSIS — L97822 Non-pressure chronic ulcer of other part of left lower leg with fat layer exposed: Secondary | ICD-10-CM | POA: Diagnosis not present

## 2017-05-12 DIAGNOSIS — J209 Acute bronchitis, unspecified: Secondary | ICD-10-CM | POA: Diagnosis not present

## 2017-05-12 DIAGNOSIS — J0100 Acute maxillary sinusitis, unspecified: Secondary | ICD-10-CM | POA: Diagnosis not present

## 2017-05-18 DIAGNOSIS — L97222 Non-pressure chronic ulcer of left calf with fat layer exposed: Secondary | ICD-10-CM | POA: Diagnosis not present

## 2017-05-18 DIAGNOSIS — L97822 Non-pressure chronic ulcer of other part of left lower leg with fat layer exposed: Secondary | ICD-10-CM | POA: Diagnosis not present

## 2017-05-18 DIAGNOSIS — E11622 Type 2 diabetes mellitus with other skin ulcer: Secondary | ICD-10-CM | POA: Diagnosis not present

## 2017-05-24 ENCOUNTER — Other Ambulatory Visit: Payer: Self-pay | Admitting: Orthopedic Surgery

## 2017-05-24 DIAGNOSIS — M48061 Spinal stenosis, lumbar region without neurogenic claudication: Secondary | ICD-10-CM

## 2017-05-25 DIAGNOSIS — L97222 Non-pressure chronic ulcer of left calf with fat layer exposed: Secondary | ICD-10-CM | POA: Diagnosis not present

## 2017-05-25 DIAGNOSIS — L97822 Non-pressure chronic ulcer of other part of left lower leg with fat layer exposed: Secondary | ICD-10-CM | POA: Diagnosis not present

## 2017-05-25 DIAGNOSIS — E11622 Type 2 diabetes mellitus with other skin ulcer: Secondary | ICD-10-CM | POA: Diagnosis not present

## 2017-06-02 DIAGNOSIS — E11622 Type 2 diabetes mellitus with other skin ulcer: Secondary | ICD-10-CM | POA: Diagnosis not present

## 2017-06-02 DIAGNOSIS — L97822 Non-pressure chronic ulcer of other part of left lower leg with fat layer exposed: Secondary | ICD-10-CM | POA: Diagnosis not present

## 2017-06-02 DIAGNOSIS — K50918 Crohn's disease, unspecified, with other complication: Secondary | ICD-10-CM | POA: Diagnosis not present

## 2017-06-02 DIAGNOSIS — L97222 Non-pressure chronic ulcer of left calf with fat layer exposed: Secondary | ICD-10-CM | POA: Diagnosis not present

## 2017-06-03 ENCOUNTER — Ambulatory Visit
Admission: RE | Admit: 2017-06-03 | Discharge: 2017-06-03 | Disposition: A | Discharge status: Home / Self Care | Payer: BLUE CROSS/BLUE SHIELD | Source: Ambulatory Visit | Attending: Orthopedic Surgery | Admitting: Orthopedic Surgery

## 2017-06-03 DIAGNOSIS — M48061 Spinal stenosis, lumbar region without neurogenic claudication: Secondary | ICD-10-CM

## 2017-06-03 DIAGNOSIS — M47817 Spondylosis without myelopathy or radiculopathy, lumbosacral region: Secondary | ICD-10-CM | POA: Diagnosis not present

## 2017-06-03 MED ORDER — METHYLPREDNISOLONE ACETATE 40 MG/ML INJ SUSP (RADIOLOG
120.0000 mg | Freq: Once | INTRAMUSCULAR | Status: AC
  Administered 2017-06-03: 120 mg via EPIDURAL

## 2017-06-03 MED ORDER — IOPAMIDOL (ISOVUE-M 200) INJECTION 41%
1.0000 mL | Freq: Once | INTRAMUSCULAR | Status: AC
  Administered 2017-06-03: 1 mL via EPIDURAL

## 2017-06-03 NOTE — Discharge Instructions (Signed)

## 2017-06-09 DIAGNOSIS — L97825 Non-pressure chronic ulcer of other part of left lower leg with muscle involvement without evidence of necrosis: Secondary | ICD-10-CM | POA: Diagnosis not present

## 2017-06-09 DIAGNOSIS — L97222 Non-pressure chronic ulcer of left calf with fat layer exposed: Secondary | ICD-10-CM | POA: Diagnosis not present

## 2017-06-09 DIAGNOSIS — E11622 Type 2 diabetes mellitus with other skin ulcer: Secondary | ICD-10-CM | POA: Diagnosis not present

## 2017-06-09 DIAGNOSIS — S81832A Puncture wound without foreign body, left lower leg, initial encounter: Secondary | ICD-10-CM | POA: Diagnosis not present

## 2017-06-09 DIAGNOSIS — R600 Localized edema: Secondary | ICD-10-CM | POA: Diagnosis not present

## 2017-06-10 DIAGNOSIS — K5090 Crohn's disease, unspecified, without complications: Secondary | ICD-10-CM | POA: Diagnosis not present

## 2017-06-14 ENCOUNTER — Other Ambulatory Visit: Payer: Self-pay | Admitting: Orthopedic Surgery

## 2017-06-14 DIAGNOSIS — G8929 Other chronic pain: Secondary | ICD-10-CM

## 2017-06-14 DIAGNOSIS — M545 Low back pain: Principal | ICD-10-CM

## 2017-06-15 ENCOUNTER — Encounter: Payer: Self-pay | Admitting: Obstetrics and Gynecology

## 2017-06-16 DIAGNOSIS — L97822 Non-pressure chronic ulcer of other part of left lower leg with fat layer exposed: Secondary | ICD-10-CM | POA: Diagnosis not present

## 2017-06-16 DIAGNOSIS — E11622 Type 2 diabetes mellitus with other skin ulcer: Secondary | ICD-10-CM | POA: Diagnosis not present

## 2017-06-17 ENCOUNTER — Ambulatory Visit
Admission: RE | Admit: 2017-06-17 | Discharge: 2017-06-17 | Disposition: A | Discharge status: Home / Self Care | Payer: BLUE CROSS/BLUE SHIELD | Source: Ambulatory Visit | Attending: Orthopedic Surgery | Admitting: Orthopedic Surgery

## 2017-06-17 DIAGNOSIS — G8929 Other chronic pain: Secondary | ICD-10-CM

## 2017-06-17 DIAGNOSIS — M47817 Spondylosis without myelopathy or radiculopathy, lumbosacral region: Secondary | ICD-10-CM | POA: Diagnosis not present

## 2017-06-17 DIAGNOSIS — M545 Low back pain: Principal | ICD-10-CM

## 2017-06-17 MED ORDER — IOPAMIDOL (ISOVUE-M 200) INJECTION 41%
1.0000 mL | Freq: Once | INTRAMUSCULAR | Status: AC
  Administered 2017-06-17: 1 mL via EPIDURAL

## 2017-06-17 MED ORDER — METHYLPREDNISOLONE ACETATE 40 MG/ML INJ SUSP (RADIOLOG
120.0000 mg | Freq: Once | INTRAMUSCULAR | Status: AC
  Administered 2017-06-17: 120 mg via EPIDURAL

## 2017-06-17 NOTE — Discharge Instructions (Signed)

## 2017-06-23 DIAGNOSIS — L97822 Non-pressure chronic ulcer of other part of left lower leg with fat layer exposed: Secondary | ICD-10-CM | POA: Diagnosis not present

## 2017-06-23 DIAGNOSIS — E11622 Type 2 diabetes mellitus with other skin ulcer: Secondary | ICD-10-CM | POA: Diagnosis not present

## 2017-06-30 DIAGNOSIS — L97822 Non-pressure chronic ulcer of other part of left lower leg with fat layer exposed: Secondary | ICD-10-CM | POA: Diagnosis not present

## 2017-06-30 DIAGNOSIS — E11622 Type 2 diabetes mellitus with other skin ulcer: Secondary | ICD-10-CM | POA: Diagnosis not present

## 2017-07-08 DIAGNOSIS — J0100 Acute maxillary sinusitis, unspecified: Secondary | ICD-10-CM | POA: Diagnosis not present

## 2017-07-13 DIAGNOSIS — Y92096 Garden or yard of other non-institutional residence as the place of occurrence of the external cause: Secondary | ICD-10-CM | POA: Diagnosis not present

## 2017-07-13 DIAGNOSIS — L97822 Non-pressure chronic ulcer of other part of left lower leg with fat layer exposed: Secondary | ICD-10-CM | POA: Diagnosis not present

## 2017-07-13 DIAGNOSIS — M4807 Spinal stenosis, lumbosacral region: Secondary | ICD-10-CM | POA: Diagnosis not present

## 2017-07-13 DIAGNOSIS — W1830XA Fall on same level, unspecified, initial encounter: Secondary | ICD-10-CM | POA: Diagnosis not present

## 2017-07-13 DIAGNOSIS — S81832A Puncture wound without foreign body, left lower leg, initial encounter: Secondary | ICD-10-CM | POA: Diagnosis not present

## 2017-07-13 DIAGNOSIS — E11622 Type 2 diabetes mellitus with other skin ulcer: Secondary | ICD-10-CM | POA: Diagnosis not present

## 2017-07-13 DIAGNOSIS — K50019 Crohn's disease of small intestine with unspecified complications: Secondary | ICD-10-CM | POA: Diagnosis not present

## 2017-07-20 DIAGNOSIS — W010XXA Fall on same level from slipping, tripping and stumbling without subsequent striking against object, initial encounter: Secondary | ICD-10-CM | POA: Diagnosis not present

## 2017-07-20 DIAGNOSIS — E11628 Type 2 diabetes mellitus with other skin complications: Secondary | ICD-10-CM | POA: Diagnosis not present

## 2017-07-20 DIAGNOSIS — L97822 Non-pressure chronic ulcer of other part of left lower leg with fat layer exposed: Secondary | ICD-10-CM | POA: Diagnosis not present

## 2017-07-20 DIAGNOSIS — M4807 Spinal stenosis, lumbosacral region: Secondary | ICD-10-CM | POA: Diagnosis not present

## 2017-07-20 DIAGNOSIS — E11622 Type 2 diabetes mellitus with other skin ulcer: Secondary | ICD-10-CM | POA: Diagnosis not present

## 2017-07-20 DIAGNOSIS — Y93H2 Activity, gardening and landscaping: Secondary | ICD-10-CM | POA: Diagnosis not present

## 2017-07-20 DIAGNOSIS — Y92007 Garden or yard of unspecified non-institutional (private) residence as the place of occurrence of the external cause: Secondary | ICD-10-CM | POA: Diagnosis not present

## 2017-07-20 DIAGNOSIS — K50019 Crohn's disease of small intestine with unspecified complications: Secondary | ICD-10-CM | POA: Diagnosis not present

## 2017-07-20 DIAGNOSIS — S81802A Unspecified open wound, left lower leg, initial encounter: Secondary | ICD-10-CM | POA: Diagnosis not present

## 2017-07-25 DIAGNOSIS — K5090 Crohn's disease, unspecified, without complications: Secondary | ICD-10-CM | POA: Diagnosis not present

## 2017-07-25 DIAGNOSIS — R2689 Other abnormalities of gait and mobility: Secondary | ICD-10-CM | POA: Diagnosis not present

## 2017-07-27 DIAGNOSIS — Y92046 Garden or yard of boarding-house as the place of occurrence of the external cause: Secondary | ICD-10-CM | POA: Diagnosis not present

## 2017-07-27 DIAGNOSIS — L97222 Non-pressure chronic ulcer of left calf with fat layer exposed: Secondary | ICD-10-CM | POA: Diagnosis not present

## 2017-07-27 DIAGNOSIS — M4807 Spinal stenosis, lumbosacral region: Secondary | ICD-10-CM | POA: Diagnosis not present

## 2017-07-27 DIAGNOSIS — L97822 Non-pressure chronic ulcer of other part of left lower leg with fat layer exposed: Secondary | ICD-10-CM | POA: Diagnosis not present

## 2017-07-27 DIAGNOSIS — K50019 Crohn's disease of small intestine with unspecified complications: Secondary | ICD-10-CM | POA: Diagnosis not present

## 2017-07-27 DIAGNOSIS — E11622 Type 2 diabetes mellitus with other skin ulcer: Secondary | ICD-10-CM | POA: Diagnosis not present

## 2017-07-27 DIAGNOSIS — W010XXA Fall on same level from slipping, tripping and stumbling without subsequent striking against object, initial encounter: Secondary | ICD-10-CM | POA: Diagnosis not present

## 2017-07-27 DIAGNOSIS — S81832A Puncture wound without foreign body, left lower leg, initial encounter: Secondary | ICD-10-CM | POA: Diagnosis not present

## 2017-08-05 DIAGNOSIS — L97829 Non-pressure chronic ulcer of other part of left lower leg with unspecified severity: Secondary | ICD-10-CM | POA: Diagnosis not present

## 2017-08-05 DIAGNOSIS — E11622 Type 2 diabetes mellitus with other skin ulcer: Secondary | ICD-10-CM | POA: Diagnosis not present

## 2017-08-05 DIAGNOSIS — K50019 Crohn's disease of small intestine with unspecified complications: Secondary | ICD-10-CM | POA: Diagnosis not present

## 2017-08-05 DIAGNOSIS — M4807 Spinal stenosis, lumbosacral region: Secondary | ICD-10-CM | POA: Diagnosis not present

## 2017-08-08 ENCOUNTER — Ambulatory Visit: Payer: BLUE CROSS/BLUE SHIELD | Attending: Orthopedic Surgery | Admitting: Physical Therapy

## 2017-08-08 ENCOUNTER — Encounter: Payer: Self-pay | Admitting: Physical Therapy

## 2017-08-08 DIAGNOSIS — R296 Repeated falls: Secondary | ICD-10-CM | POA: Insufficient documentation

## 2017-08-08 DIAGNOSIS — M21371 Foot drop, right foot: Secondary | ICD-10-CM | POA: Diagnosis not present

## 2017-08-08 DIAGNOSIS — R2689 Other abnormalities of gait and mobility: Secondary | ICD-10-CM

## 2017-08-08 DIAGNOSIS — M6281 Muscle weakness (generalized): Secondary | ICD-10-CM | POA: Diagnosis not present

## 2017-08-08 DIAGNOSIS — R2681 Unsteadiness on feet: Secondary | ICD-10-CM | POA: Diagnosis not present

## 2017-08-08 NOTE — Therapy (Signed)
[facility] [address] [place], [place], [address] Phone: [phone]   Fax:  [phone]  Physical Therapy Treatment  Patient Details  Name: Catherine Nicholson MRN: [id number] Date of Birth: 01-02-1961 Referring Provider: Elsie Saas, MD   Encounter Date: 08/08/2017  PT End of Session - 08/08/17 1355    Visit Number  1    Number of Visits  20    Date for PT Re-Evaluation  10/14/17    Authorization Type  BCBS deductable MET, 20% co-insurance, 30 visit OT/PT comb limit    Authorization - Visit Number  1    Authorization - Number of Visits  30    PT Start Time  1300    PT Stop Time  1340    PT Time Calculation (min)  40 min    Equipment Utilized During Treatment  Gait belt    Activity Tolerance  Patient tolerated treatment well    Behavior During Therapy  WFL for tasks assessed/performed       Past Medical History:  Diagnosis Date  . Allergy   . Anemia   . Anxiety   . Cataract   . Clotting disorder ([place])   . Crohn's colitis ([place]) 08-2011   Colonoscopy  . Depression   . GERD (gastroesophageal reflux disease)   . Glaucoma   . Hemorrhoids 08-2011   Colonoscopy   . Hyperlipidemia   . Hypertension   . IBS (irritable bowel syndrome)   . PID (pelvic inflammatory disease) 1989   with HSG  . Regional enteritis of unspecified site 10/25/2011    Past Surgical History:  Procedure Laterality Date  . BREAST BIOPSY  06/2002   Fibroadenoma  . CAROTID ARTERY - SUBCLAVIAN ARTERY BYPASS GRAFT  2004  . CESAREAN SECTION  1991  . EYE SURGERY     bi-lat lens transplant  . NASAL SINUS SURGERY  1998   Reconstruction   . TONSILLECTOMY  1992  . TUBAL LIGATION      There were no vitals filed for this visit.  Subjective Assessment - 08/08/17 1308    Subjective  This 56yo female was referred on 07/29/2017 by Elsie Saas, MD for Lumbar stenosis with gait abnormality. She fell in May 2018 2x both resulting compression  fractures of lumbar spine. She noticed increased numbness & weakness.     Pertinent History  lumbar stenosis,Crohn's colitis, diabetes, obesity, HTN    Patient Stated Goals  Walk with no problems    Currently in Pain?  No/denies         [facility] PT Assessment - 08/08/17 1300      Assessment   Medical Diagnosis  Lumbar Stenosis with Gait Abnormality    Referring Provider  Elsie Saas, MD    Onset Date/Surgical Date  07/29/17 MD referral    Hand Dominance  Right    Prior Therapy  none      Precautions   Precautions  Fall      Balance Screen   Has the patient fallen in the past 6 months  Yes    How many times?  2 legs give out    Has the patient had a decrease in activity level because of a fear of falling?   Yes    Is the patient reluctant to leave their home because of a fear of falling?   No      Home Film/video editor residence    Living Arrangements  Spouse/significant other  87yo daughter    Type of Home  House    Home Access  Stairs to enter    Entrance Stairs-Number of Steps  3    Entrance Stairs-Rails  None    Home Layout  Two level;Full bath on main level;Able to live on main level with bedroom/bathroom upstairs are extra bedrooms & bath    Alternate Level Stairs-Number of Steps  14    Alternate Level Stairs-Rails  Right    Home Equipment  Walker - 4 wheels;Cane - single point      Prior Function   Level of Independence  Independent;Independent with community mobility without device;Independent with gait    Vocation  Full time employment    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 sitting down,     Leisure  Genology, Walking      ROM / Strength   AROM / PROM / Strength  Strength      Strength   Overall Strength  Deficits    Overall Strength Comments  Bil shoulders 4/5, fair grip    Strength Assessment Site  Hip;Knee;Ankle    Right/Left Hip  Right;Left    Right Hip Flexion  3+/5    Right Hip Extension  2/5    Right Hip ABduction   2+/5    Left Hip Flexion  4/5    Left Hip Extension  2+/5    Left Hip ABduction  3/5    Right/Left Knee  Right;Left    Right Knee Flexion  3/5    Right Knee Extension  4+/5    Left Knee Flexion  3+/5    Left Knee Extension  5/5    Right/Left Ankle  Right;Left    Right Ankle Dorsiflexion  3/5    Right Ankle Plantar Flexion  2/5    Left Ankle Dorsiflexion  4/5    Left Ankle Plantar Flexion  2+/5      Transfers   Transfers  Sit to Stand;Stand to Sit    Sit to Stand  5: Supervision;With upper extremity assist;With armrests;From chair/3-in-1    Stand to Sit  5: Supervision;With upper extremity assist;With armrests;To chair/3-in-1      Ambulation/Gait   Ambulation/Gait  Yes    Ambulation/Gait Assistance  5: Supervision    Ambulation Distance (Feet)  150 Feet    Assistive device  None    Gait Pattern  Step-through pattern;Decreased step length - left;Decreased stance time - right;Decreased stride length;Decreased hip/knee flexion - right;Decreased dorsiflexion - right;Right hip hike;Left hip hike;Right steppage;Right foot flat;Antalgic;Wide base of support;Poor foot clearance - right    Ambulation Surface  Indoor;Level    Gait velocity  1.75 ft/sec     Stairs  Yes    Stairs Assistance  5: Supervision    Stairs Assistance Details (indicate cue type and reason)  very slow, cautious movements    Stair Management Technique  Two rails;Step to pattern;Forwards    Number of Stairs  4      Standardized Balance Assessment   Standardized Balance Assessment  Berg Balance Test;Timed Up and Go Test;Dynamic Gait Index      Berg Balance Test   Sit to Stand  Able to stand  independently using hands    Standing Unsupported  Able to stand 30 seconds unsupported    Sitting with Back Unsupported but Feet Supported on Floor or Stool  Able to sit safely and securely 2 minutes    Stand to Sit  Controls descent by using hands  Transfers  Able to transfer safely, definite need of hands    Standing  Unsupported with Eyes Closed  Able to stand 10 seconds with supervision    Standing Ubsupported with Feet Together  Needs help to attain position and unable to hold for 15 seconds    From Standing, Reach Forward with Outstretched Arm  Reaches forward but needs supervision    From Standing Position, Pick up Object from Floor  Unable to try/needs assist to keep balance    From Standing Position, Turn to Look Behind Over each Shoulder  Needs supervision when turning    Turn 360 Degrees  Needs assistance while turning    Standing Unsupported, Alternately Place Feet on Step/Stool  Needs assistance to keep from falling or unable to try    Standing Unsupported, One Foot in [employer] balance while stepping or standing    Standing on One Leg  Unable to try or needs assist to prevent fall    Total Score  20    Berg comment:  <36/56 indicates close to 100% fall risk      Dynamic Gait Index   Level Surface  Moderate Impairment    Change in Gait Speed  Severe Impairment    Gait with Horizontal Head Turns  Mild Impairment    Gait with Vertical Head Turns  Moderate Impairment    Gait and Pivot Turn  Moderate Impairment    Step Over Obstacle  Severe Impairment    Step Around Obstacles  Moderate Impairment    Steps  Severe Impairment    Total Score  6    DGI comment:  DGI </= 19/24 indicates fall risk      Timed Up and Go Test   Normal TUG (seconds)  16.16 >13.5 sec indicates high fall risk    Cognitive TUG (seconds)  20.09 >15sec & >10% increase (24.9% increase) indicates fall risk                            PT Short Term Goals - 08/08/17 2315      PT SHORT TERM GOAL #1   Title  Patient demonstrates understanding of initial HEP. (All STGs Target Date: 09/16/2017)    Time  5    Period  Weeks    Status  New    Target Date  09/16/17      PT SHORT TERM GOAL #2   Title  Time Up & Go <15sec    Time  5    Period  Weeks    Status  New    Target Date  09/16/17      PT  SHORT TERM GOAL #3   Title  Berg Balance >/= 28/56    Time  5    Period  Weeks    Status  New    Target Date  09/16/17        PT Long Term Goals - 08/08/17 2306      PT LONG TERM GOAL #1   Title  Patient demonstrates & verbalizes understanding of ongoing HEP / Fitness plan. (All LTGs Target Date: 10/21/2017)    Time  10    Period  Weeks    Status  New    Target Date  10/21/17      PT LONG TERM GOAL #2   Title  Berg Balance >36/56 to indicate lower fall risk.     Time  10  Period  Weeks    Status  New    Target Date  10/21/17      PT LONG TERM GOAL #3   Title  Timed Up & Go <13.5sec and Cognitive TUG <15.5sec to indicate lower fall risk.     Time  10    Period  Weeks    Status  New    Target Date  10/21/17      PT LONG TERM GOAL #4   Title  Dynamic Gait Index >/= 14/24 to indicate lower fall risk.     Time  10    Period  Weeks    Status  New    Target Date  10/21/17      PT LONG TERM GOAL #5   Title  Patient ambulates 500' outdoors with LRAD modified independent.     Time  10    Period  Weeks    Status  New    Target Date  10/21/17      Additional Long Term Goals   Additional Long Term Goals  Yes      PT LONG TERM GOAL #6   Title  Patient negotiates ramps, curbs & stairs with LRAD modified independent.     Time  10    Period  Weeks    Status  New    Target Date  10/21/17            Plan - 08/08/17 2257    Clinical Impression Statement  This 56yo female has bilateral lower extremity weakness impairing her mobility with high fall risk. Berg Balance 20/56 and Timed Up-Go 16.16 & cognitive TUG 20.09 (25% increase) indicate high fall risk. Her gait is impaired with gait velocity 1.29f/sec and Dynamic Gait Index 6/24 indicating high fall risk. Patient would benefit from skilled PT to improve her mobility & decrease her fall risk.     History and Personal Factors relevant to plan of care:  Lumbar stenosis, compression fractures, Crohn's colitis, diabetes,  obesity, HTN, macular degeneration    Clinical Presentation  Evolving    Clinical Presentation due to:  increased weakness after 2 falls both resulting in compression fractures, DM, signs of neuropathy but not diagnosed    Clinical Decision Making  Moderate    Rehab Potential  Good    Clinical Impairments Affecting Rehab Potential  lumbar stenosis, compression fractures, Crohn's colitis, diabetes, obesity, HTN, macular degeneration    PT Frequency  2x / week    PT Duration  Other (comment) 10 weeks    PT Treatment/Interventions  ADLs/Self Care Home Management;DME Instruction;Gait training;Stair training;Functional mobility training;Therapeutic activities;Therapeutic exercise;Balance training;Neuromuscular re-education;Patient/family education;Orthotic Fit/Training;Vestibular;Passive range of motion;Manual techniques    PT Next Visit Plan  Initiate HEP LE strength & LE/trunk flexibility    Recommended Other Services  Patient may need an AFO for right foot drop    Consulted and Agree with Plan of Care  Patient       Patient will benefit from skilled therapeutic intervention in order to improve the following deficits and impairments:  Abnormal gait, Decreased activity tolerance, Decreased balance, Decreased coordination, Decreased endurance, Decreased mobility, Decreased knowledge of use of DME, Impaired flexibility, Decreased strength, Postural dysfunction, Obesity  Visit Diagnosis: Other abnormalities of gait and mobility  Unsteadiness on feet  Muscle weakness (generalized)  Repeated falls  Foot drop, right     Problem List Patient Active Problem List   Diagnosis Date Noted  . Type 2 diabetes mellitus ([place] 01/13/2017  . Subclavian  arterial stenosis ([place]) 01/13/2017  . Iatrogenic Cushing's syndrome ([place]) 01/13/2017  . Essential hypertension 01/13/2017  . Crohn's disease of colon with rectal bleeding ([place]) 01/13/2017  . Regional enteritis of unspecified site 10/25/2011  . Family  history of malignant neoplasm of gastrointestinal tract 09/17/2011    Jamey Reas  PT, DPT 08/08/2017, 11:20 PM  [facility] [address] [place], [place], [address] Phone: [phone]   Fax:  [phone]  Name: Catherine Nicholson MRN: [id number] Date of Birth: 08-11-1961

## 2017-08-25 ENCOUNTER — Ambulatory Visit: Payer: BLUE CROSS/BLUE SHIELD | Admitting: Rehabilitation

## 2017-08-26 ENCOUNTER — Encounter: Payer: Self-pay | Admitting: Rehabilitation

## 2017-08-26 ENCOUNTER — Ambulatory Visit: Payer: BLUE CROSS/BLUE SHIELD | Attending: Orthopedic Surgery | Admitting: Rehabilitation

## 2017-08-26 DIAGNOSIS — R2689 Other abnormalities of gait and mobility: Secondary | ICD-10-CM | POA: Diagnosis present

## 2017-08-26 DIAGNOSIS — R296 Repeated falls: Secondary | ICD-10-CM

## 2017-08-26 DIAGNOSIS — M21371 Foot drop, right foot: Secondary | ICD-10-CM | POA: Diagnosis present

## 2017-08-26 DIAGNOSIS — M6281 Muscle weakness (generalized): Secondary | ICD-10-CM

## 2017-08-26 DIAGNOSIS — R2681 Unsteadiness on feet: Secondary | ICD-10-CM | POA: Diagnosis present

## 2017-08-26 NOTE — Patient Instructions (Addendum)
PELVIC TILT: Posterior    Tighten abdominals, flatten low back. Hold for 5 secs.  _10__ reps per set, _2__ sets per day, __5_ days per week   Copyright  VHI. All rights reserved.   Hamstring Stretch    Sitting with operated leg straight on bed, and foot of other leg on floor, lean forward toward toes of straight leg. Hold _60___ seconds. Repeat __2-3__ times. Do __2_ sessions per day.  http://gt2.exer.us/303   Copyright  VHI. All rights reserved.      Core hooklying marching opposite leg  Lying on your back with knees bent, slowly lift opposite foot 6 inches off the surface, keeping knee bent. Slowly return foot to surface.    Knee to Chest    Lying supine, bend involved knee to chest _2-3__ times. Hold for 30-60 secs.  Repeat with other leg. Do _2-3__ times per day.  Copyright  VHI. All rights reserved.    Abduction: Clam (Eccentric) - Side-Lying    Lie on side with knees bent. Lift top knee, keeping feet together. Keep trunk steady. Slowly lower for 3-5 seconds. _10__ reps per set, _2__ sets per day, _5-7__ days per week. Add _1-2 lbs when you achieve _10-15__ repetitions and you no longer feel its challenging.   http://ecce.exer.us/65   Copyright  VHI. All rights reserved.   Straight Leg Raise    Tighten stomach and slowly raise locked right leg to the height of the other knee.  Repeat __5-8__ times per set. Do _1___ sets per session. Do _2__ sessions per day.  Once you are able to do 10 reps fairly easily, add 1-2 pound ankle weight.    http://orth.exer.us/1103   Copyright  VHI. All rights reserved.       WALKING  Walking is a great form of exercise to increase your strength, endurance and overall fitness.  A walking program can help you start slowly and gradually build endurance as you go.  Everyone's ability is different, so each person's starting point will be different.  You do not have to follow them exactly.  The are just samples. You  should simply find out what's right for you and stick to that program.   In the beginning, you'll start off walking 2-3 times a day for short distances.  As you get stronger, you'll be walking further at just 1-2 times per day.  A. You Can Walk For A Certain Length Of Time Each Day    Walk 2-3 minutes 4-5 times per day.  Increase 1 minutes every 7 days (3 times per day).  Work up to 25-30 minutes (1-2 times per day).   B. You Can Walk For a Certain Distance Each Day     Distance can be substituted for time.    Example:   3 trips to mailbox (at road)   3 trips to corner of block   3 trips around the block  C. Go to local [employer] and use the track.    Walk for distance ____ around track  Or time ____ minutes  Please only do the exercises that your therapist has initialed and dated

## 2017-08-26 NOTE — Therapy (Signed)
[facility] [address] [place], [place], [address] Phone: [phone]   Fax:  [phone]  Physical Therapy Treatment  Patient Details  Name: Catherine Nicholson MRN: [id number] Date of Birth: 07/03/1961 Referring Provider: Elsie Saas, MD   Encounter Date: 08/26/2017  PT End of Session - 08/26/17 1627    Visit Number  2    Number of Visits  20    Date for PT Re-Evaluation  10/14/17    Authorization Type  BCBS deductable MET, 20% co-insurance, 30 visit OT/PT comb limit    Authorization - Visit Number  2    Authorization - Number of Visits  30    PT Start Time  1622    PT Stop Time  1705    PT Time Calculation (min)  43 min    Equipment Utilized During Treatment  Gait belt    Activity Tolerance  Patient tolerated treatment well    Behavior During Therapy  WFL for tasks assessed/performed       Past Medical History:  Diagnosis Date  . Allergy   . Anemia   . Anxiety   . Cataract   . Clotting disorder ([place])   . Crohn's colitis ([place]) 08-2011   Colonoscopy  . Depression   . GERD (gastroesophageal reflux disease)   . Glaucoma   . Hemorrhoids 08-2011   Colonoscopy   . Hyperlipidemia   . Hypertension   . IBS (irritable bowel syndrome)   . PID (pelvic inflammatory disease) 1989   with HSG  . Regional enteritis of unspecified site 10/25/2011    Past Surgical History:  Procedure Laterality Date  . BREAST BIOPSY  06/2002   Fibroadenoma  . CAROTID ARTERY - SUBCLAVIAN ARTERY BYPASS GRAFT  2004  . CESAREAN SECTION  1991  . EYE SURGERY     bi-lat lens transplant  . NASAL SINUS SURGERY  1998   Reconstruction   . TONSILLECTOMY  1992  . TUBAL LIGATION      There were no vitals filed for this visit.  Subjective Assessment - 08/26/17 1625    Subjective  Reports no changes since last visit, no falls.     Pertinent History  lumbar stenosis,Crohn's colitis, diabetes, obesity, HTN    Patient Stated Goals  Walk with no  problems    Currently in Pain?  No/denies                      [facility] Adult PT Treatment/Exercise - 08/26/17 0001      Ambulation/Gait   Ambulation/Gait  Yes    Ambulation/Gait Assistance  5: Supervision    Ambulation/Gait Assistance Details  Continue to assess gait during session.  Pt able to ambulate x 115 without AD, however due to muscle fatigue was unable to continue but reports no increase in pain.  Note pt with very limited strength in hip, esp on R side with heavy Trendelenburg gait and trunk extension and rotation to the R.  Cues to correct this during session and education to continue to use cane to decrease compensatory patterns and use of rollator when doing walking program.      Ambulation Distance (Feet)  115 Feet    Assistive device  None    Gait Pattern  Step-through pattern;Decreased step length - left;Decreased stance time - right;Decreased stride length;Decreased hip/knee flexion - right;Decreased dorsiflexion - right;Right hip hike;Left hip hike;Right steppage;Right foot flat;Antalgic;Wide base of support;Poor foot clearance - right    Ambulation  Surface  Level;Indoor      Self-Care   Self-Care  Other Self-Care Comments    Other Self-Care Comments   Education on beginning walking program at home with use of rollator, see pt instructions.        Exercises   Exercises  Other Exercises    Other Exercises   Performed exercises to improve core strength, BLE flexibility and strength.  See pt instruciton on details.  Note that PT added heel raises at end of session and hand wrote this on handout.               PT Education - 08/26/17 2037    Education provided  Yes    Education Details  HEP, walking program    Person(s) Educated  Patient;Child(ren)    Methods  Explanation;Demonstration;Handout;Verbal cues    Comprehension  Verbalized understanding;Returned demonstration       PT Short Term Goals - 08/08/17 2315      PT SHORT TERM GOAL #1   Title   Patient demonstrates understanding of initial HEP. (All STGs Target Date: 09/16/2017)    Time  5    Period  Weeks    Status  New    Target Date  09/16/17      PT SHORT TERM GOAL #2   Title  Time Up & Go <15sec    Time  5    Period  Weeks    Status  New    Target Date  09/16/17      PT SHORT TERM GOAL #3   Title  Berg Balance >/= 28/56    Time  5    Period  Weeks    Status  New    Target Date  09/16/17        PT Long Term Goals - 08/08/17 2306      PT LONG TERM GOAL #1   Title  Patient demonstrates & verbalizes understanding of ongoing HEP / Fitness plan. (All LTGs Target Date: 10/21/2017)    Time  10    Period  Weeks    Status  New    Target Date  10/21/17      PT LONG TERM GOAL #2   Title  Berg Balance >36/56 to indicate lower fall risk.     Time  10    Period  Weeks    Status  New    Target Date  10/21/17      PT LONG TERM GOAL #3   Title  Timed Up & Go <13.5sec and Cognitive TUG <15.5sec to indicate lower fall risk.     Time  10    Period  Weeks    Status  New    Target Date  10/21/17      PT LONG TERM GOAL #4   Title  Dynamic Gait Index >/= 14/24 to indicate lower fall risk.     Time  10    Period  Weeks    Status  New    Target Date  10/21/17      PT LONG TERM GOAL #5   Title  Patient ambulates 500' outdoors with LRAD modified independent.     Time  10    Period  Weeks    Status  New    Target Date  10/21/17      Additional Long Term Goals   Additional Long Term Goals  Yes      PT LONG TERM GOAL #6   Title  Patient negotiates ramps, curbs & stairs with LRAD modified independent.     Time  10    Period  Weeks    Status  New    Target Date  10/21/17            Plan - 08/26/17 2038    Clinical Impression Statement  Skilled session focused on initiation of HEP for core strength, BLE flexibility and BLE strength.  Also initiated walking program for pt to improve endurance as she is only able to tolerate 115' during session.  Pt demonstrates  significant weakness and compensatory gait patterns.      Rehab Potential  Good    Clinical Impairments Affecting Rehab Potential  lumbar stenosis, compression fractures, Crohn's colitis, diabetes, obesity, HTN, macular degeneration    PT Frequency  2x / week    PT Duration  Other (comment) 10 weeks    PT Treatment/Interventions  ADLs/Self Care Home Management;DME Instruction;Gait training;Stair training;Functional mobility training;Therapeutic activities;Therapeutic exercise;Balance training;Neuromuscular re-education;Patient/family education;Orthotic Fit/Training;Vestibular;Passive range of motion;Manual techniques    PT Next Visit Plan  Gait with cane vs rollator, continue with core strength and BLE strengthening, endurance.     Consulted and Agree with Plan of Care  Patient       Patient will benefit from skilled therapeutic intervention in order to improve the following deficits and impairments:  Abnormal gait, Decreased activity tolerance, Decreased balance, Decreased coordination, Decreased endurance, Decreased mobility, Decreased knowledge of use of DME, Impaired flexibility, Decreased strength, Postural dysfunction, Obesity  Visit Diagnosis: Other abnormalities of gait and mobility  Unsteadiness on feet  Muscle weakness (generalized)  Repeated falls     Problem List Patient Active Problem List   Diagnosis Date Noted  . Type 2 diabetes mellitus ([place]) 01/13/2017  . Subclavian arterial stenosis ([place]) 01/13/2017  . Iatrogenic Cushing's syndrome ([place]) 01/13/2017  . Essential hypertension 01/13/2017  . Crohn's disease of colon with rectal bleeding ([place]) 01/13/2017  . Regional enteritis of unspecified site 10/25/2011  . Family history of malignant neoplasm of gastrointestinal tract 09/17/2011    Cameron Sprang, PT, MPT [facility] [address] [place], [place], [address] Phone: [phone]   Fax:  [phone] 08/26/17, 8:41  PM  Name: Catherine Nicholson MRN: [id number] Date of Birth: March 31, 1961

## 2017-08-29 ENCOUNTER — Ambulatory Visit: Payer: Self-pay | Admitting: Physical Therapy

## 2017-08-31 ENCOUNTER — Ambulatory Visit: Payer: BLUE CROSS/BLUE SHIELD | Admitting: Physical Therapy

## 2017-09-05 ENCOUNTER — Encounter: Payer: Self-pay | Admitting: Rehabilitation

## 2017-09-05 ENCOUNTER — Ambulatory Visit: Payer: BLUE CROSS/BLUE SHIELD | Admitting: Rehabilitation

## 2017-09-05 DIAGNOSIS — R2689 Other abnormalities of gait and mobility: Secondary | ICD-10-CM

## 2017-09-05 DIAGNOSIS — M6281 Muscle weakness (generalized): Secondary | ICD-10-CM

## 2017-09-05 DIAGNOSIS — R2681 Unsteadiness on feet: Secondary | ICD-10-CM

## 2017-09-05 DIAGNOSIS — R296 Repeated falls: Secondary | ICD-10-CM

## 2017-09-06 NOTE — Therapy (Signed)
[facility] [address] [place], [place], [address] Phone: [phone]   Fax:  [phone]  Physical Therapy Treatment  Patient Details  Name: Catherine Nicholson MRN: [id number] Date of Birth: Aug 15, 1961 Referring Provider: Elsie Saas, MD   Encounter Date: 09/05/2017  PT End of Session - 09/06/17 1208    Visit Number  3    Number of Visits  20    Date for PT Re-Evaluation  10/14/17    Authorization Type  BCBS deductable MET, 20% co-insurance, 30 visit OT/PT comb limit    Authorization - Visit Number  3    Authorization - Number of Visits  30    PT Start Time  9892    PT Stop Time  1615    PT Time Calculation (min)  44 min    Equipment Utilized During Treatment  Gait belt    Activity Tolerance  Patient tolerated treatment well    Behavior During Therapy  WFL for tasks assessed/performed       Past Medical History:  Diagnosis Date  . Allergy   . Anemia   . Anxiety   . Cataract   . Clotting disorder ([place])   . Crohn's colitis ([place]) 08-2011   Colonoscopy  . Depression   . GERD (gastroesophageal reflux disease)   . Glaucoma   . Hemorrhoids 08-2011   Colonoscopy   . Hyperlipidemia   . Hypertension   . IBS (irritable bowel syndrome)   . PID (pelvic inflammatory disease) 1989   with HSG  . Regional enteritis of unspecified site 10/25/2011    Past Surgical History:  Procedure Laterality Date  . BREAST BIOPSY  06/2002   Fibroadenoma  . CAROTID ARTERY - SUBCLAVIAN ARTERY BYPASS GRAFT  2004  . CESAREAN SECTION  1991  . EYE SURGERY     bi-lat lens transplant  . NASAL SINUS SURGERY  1998   Reconstruction   . TONSILLECTOMY  1992  . TUBAL LIGATION      There were no vitals filed for this visit.  Subjective Assessment - 09/05/17 1535    Subjective  Feels like things are continuing to get better.     Pertinent History  lumbar stenosis,Crohn's colitis, diabetes, obesity, HTN    Patient Stated Goals  Walk with  no problems    Currently in Pain?  No/denies                      [facility] Adult PT Treatment/Exercise - 09/06/17 0001      Ambulation/Gait   Ambulation/Gait  Yes    Ambulation/Gait Assistance  5: Supervision    Ambulation/Gait Assistance Details  Had pt ambulate with rollator today during session to better assess quality of gait.  Note marked improvement in quality with more upright posture, narrower BOS, and less trunk compensations.  Recommend she utilize AD (either cane or rollator) during gait in order to improve quality of gait and avoid poor compensations that could lead to increased back pain.  Pt able to tolerate 2 mins of ambulation today during session, therefore recommend this amount of time for walking program and to do several times per day.  Pt verbalized understanding.     Ambulation Distance (Feet)  230 Feet    Assistive device  4-wheeled walker    Gait Pattern  Step-through pattern;Decreased step length - left;Decreased stance time - right;Decreased stride length;Decreased hip/knee flexion - right;Decreased dorsiflexion - right;Right hip hike;Left hip hike;Right steppage;Right foot flat;Antalgic;Wide base  of support;Poor foot clearance - right    Ambulation Surface  Level;Indoor      Exercises   Exercises  Other Exercises    Other Exercises   standing mini squats with chair behind for safety x 10 reps with mod cues for technique, standing hip extension x 10 reps, standing hip abd x 10 reps with tactile cues at pelvis and hips to avoid trunk rotation.  Walking along counter top on toes x 2 reps down and back progressing to heel walking x 2 reps down and back. While seated on physioball for core exercise performed seated marching x 10 reps, seated scapular retraction x 10 reps with red band with cues for improved anterior pelvic tilt, seated diagonals using red theraband x 10 reps on each side with cues for maintaining anterior pelvic tilt. ended session with sit<>stand x  10 reps with hands on lap.  Facilitation for forward weight shift to prevent pt locking knees for stability.               PT Education - 09/05/17 1536    Education provided  Yes    Education Details  walking program.     Person(s) Educated  Patient    Methods  Explanation    Comprehension  Verbalized understanding       PT Short Term Goals - 08/08/17 2315      PT SHORT TERM GOAL #1   Title  Patient demonstrates understanding of initial HEP. (All STGs Target Date: 09/16/2017)    Time  5    Period  Weeks    Status  New    Target Date  09/16/17      PT SHORT TERM GOAL #2   Title  Time Up & Go <15sec    Time  5    Period  Weeks    Status  New    Target Date  09/16/17      PT SHORT TERM GOAL #3   Title  Berg Balance >/= 28/56    Time  5    Period  Weeks    Status  New    Target Date  09/16/17        PT Long Term Goals - 08/08/17 2306      PT LONG TERM GOAL #1   Title  Patient demonstrates & verbalizes understanding of ongoing HEP / Fitness plan. (All LTGs Target Date: 10/21/2017)    Time  10    Period  Weeks    Status  New    Target Date  10/21/17      PT LONG TERM GOAL #2   Title  Berg Balance >36/56 to indicate lower fall risk.     Time  10    Period  Weeks    Status  New    Target Date  10/21/17      PT LONG TERM GOAL #3   Title  Timed Up & Go <13.5sec and Cognitive TUG <15.5sec to indicate lower fall risk.     Time  10    Period  Weeks    Status  New    Target Date  10/21/17      PT LONG TERM GOAL #4   Title  Dynamic Gait Index >/= 14/24 to indicate lower fall risk.     Time  10    Period  Weeks    Status  New    Target Date  10/21/17      PT LONG TERM  GOAL #5   Title  Patient ambulates 500' outdoors with LRAD modified independent.     Time  10    Period  Weeks    Status  New    Target Date  10/21/17      Additional Long Term Goals   Additional Long Term Goals  Yes      PT LONG TERM GOAL #6   Title  Patient negotiates ramps, curbs &  stairs with LRAD modified independent.     Time  10    Period  Weeks    Status  New    Target Date  10/21/17            Plan - 09/06/17 1209    Clinical Impression Statement  Session continues to focus on BLE and core strengthening.  Pt with VERY weak LEs and core.  Note near fall during squats therefore recommend if she do at home to have chair behind her for safety.     Rehab Potential  Good    Clinical Impairments Affecting Rehab Potential  lumbar stenosis, compression fractures, Crohn's colitis, diabetes, obesity, HTN, macular degeneration    PT Frequency  2x / week    PT Duration  Other (comment) 10 weeks    PT Treatment/Interventions  ADLs/Self Care Home Management;DME Instruction;Gait training;Stair training;Functional mobility training;Therapeutic activities;Therapeutic exercise;Balance training;Neuromuscular re-education;Patient/family education;Orthotic Fit/Training;Vestibular;Passive range of motion;Manual techniques    PT Next Visit Plan  Gait with cane vs rollator, continue with core strength and BLE strengthening, endurance.     Consulted and Agree with Plan of Care  Patient       Patient will benefit from skilled therapeutic intervention in order to improve the following deficits and impairments:  Abnormal gait, Decreased activity tolerance, Decreased balance, Decreased coordination, Decreased endurance, Decreased mobility, Decreased knowledge of use of DME, Impaired flexibility, Decreased strength, Postural dysfunction, Obesity  Visit Diagnosis: Other abnormalities of gait and mobility  Unsteadiness on feet  Muscle weakness (generalized)  Repeated falls     Problem List Patient Active Problem List   Diagnosis Date Noted  . Type 2 diabetes mellitus ([place]) 01/13/2017  . Subclavian arterial stenosis ([place]) 01/13/2017  . Iatrogenic Cushing's syndrome ([place]) 01/13/2017  . Essential hypertension 01/13/2017  . Crohn's disease of colon with rectal bleeding ([place])  01/13/2017  . Regional enteritis of unspecified site 10/25/2011  . Family history of malignant neoplasm of gastrointestinal tract 09/17/2011    Cameron Sprang, PT, MPT [facility] [address] [place], [place], [address] Phone: [phone]   Fax:  [phone] 09/06/17, 12:12 PM  Name: Catherine Nicholson MRN: [id number] Date of Birth: 1961/08/18

## 2017-09-08 ENCOUNTER — Encounter: Payer: Self-pay | Admitting: Rehabilitation

## 2017-09-08 ENCOUNTER — Ambulatory Visit: Payer: BLUE CROSS/BLUE SHIELD | Admitting: Rehabilitation

## 2017-09-08 DIAGNOSIS — R2681 Unsteadiness on feet: Secondary | ICD-10-CM

## 2017-09-08 DIAGNOSIS — M6281 Muscle weakness (generalized): Secondary | ICD-10-CM

## 2017-09-08 DIAGNOSIS — R2689 Other abnormalities of gait and mobility: Secondary | ICD-10-CM | POA: Diagnosis not present

## 2017-09-08 NOTE — Therapy (Signed)
[facility] [address] [place], [place], [address] Phone: [phone]   Fax:  [phone]  Physical Therapy Treatment  Patient Details  Name: Catherine Nicholson MRN: [id number] Date of Birth: 06-15-1961 Referring Provider: Elsie Saas, MD   Encounter Date: 09/08/2017  PT End of Session - 09/08/17 1620    Visit Number  4    Number of Visits  20    Date for PT Re-Evaluation  10/14/17    Authorization Type  BCBS deductable MET, 20% co-insurance, 30 visit OT/PT comb limit    Authorization - Visit Number  4    Authorization - Number of Visits  30    PT Start Time  1618    PT Stop Time  1657    PT Time Calculation (min)  39 min    Equipment Utilized During Treatment  Gait belt    Activity Tolerance  Patient tolerated treatment well    Behavior During Therapy  WFL for tasks assessed/performed       Past Medical History:  Diagnosis Date  . Allergy   . Anemia   . Anxiety   . Cataract   . Clotting disorder ([place])   . Crohn's colitis ([place]) 08-2011   Colonoscopy  . Depression   . GERD (gastroesophageal reflux disease)   . Glaucoma   . Hemorrhoids 08-2011   Colonoscopy   . Hyperlipidemia   . Hypertension   . IBS (irritable bowel syndrome)   . PID (pelvic inflammatory disease) 1989   with HSG  . Regional enteritis of unspecified site 10/25/2011    Past Surgical History:  Procedure Laterality Date  . BREAST BIOPSY  06/2002   Fibroadenoma  . CAROTID ARTERY - SUBCLAVIAN ARTERY BYPASS GRAFT  2004  . CESAREAN SECTION  1991  . EYE SURGERY     bi-lat lens transplant  . NASAL SINUS SURGERY  1998   Reconstruction   . TONSILLECTOMY  1992  . TUBAL LIGATION      There were no vitals filed for this visit.  Subjective Assessment - 09/08/17 1619    Subjective  Reports no changes, no falls.  Slight "hitch" in hip, but is okay.      Pertinent History  lumbar stenosis,Crohn's colitis, diabetes, obesity, HTN    Patient  Stated Goals  Walk with no problems    Currently in Pain?  No/denies doesn't rate hip today                      [facility] Adult PT Treatment/Exercise - 09/08/17 0001      Neuro Re-ed    Neuro Re-ed Details   In // bars on small rocker board biased vertically had pt stand with feet apart maintaining balance x 2 sets of 20 secs> feet apart EC x 3 sets of 20 secs> feet apart EO with head turns side to side x 10 reps.  Intermittent support needed for balance and cues to improve forward weight shift.        Exercises   Exercises  Other Exercises    Other Exercises   supine BLE bridging x 10 reps with post pelvic tilt,  BLE bridging with leg on physioball x 8 reps, prone alternating leg extension x 5 reps, prone knee flex x 5 reps (with red theraband on LLE), prone alternating LE/UE extension x 5 reps each. Note markedly weaker R LE vs LLE in all prone tasks.  In // bars had pt standing on  both rocker board and inverted BOSU working to increase B ankle PF strength.  Performed x 10 reps and performed clockwise and counter clockwise rotations on BOSU x 5 reps.  Also in // bars with R LE in stance on 10" step advancing LLE to top of 14" step (blue airex placed on 10" step to continue to work on ankle strength) x 5 reps on each side.  Requires min to mod A due to LE weakness during task and cues for increased knee extension.               PT Education - 09/08/17 1946    Education provided  Yes    Education Details  Continue to educate on walking program.     Person(s) Educated  Patient;Child(ren)    Methods  Explanation    Comprehension  Verbalized understanding       PT Short Term Goals - 08/08/17 2315      PT SHORT TERM GOAL #1   Title  Patient demonstrates understanding of initial HEP. (All STGs Target Date: 09/16/2017)    Time  5    Period  Weeks    Status  New    Target Date  09/16/17      PT SHORT TERM GOAL #2   Title  Time Up & Go <15sec    Time  5    Period  Weeks     Status  New    Target Date  09/16/17      PT SHORT TERM GOAL #3   Title  Berg Balance >/= 28/56    Time  5    Period  Weeks    Status  New    Target Date  09/16/17        PT Long Term Goals - 08/08/17 2306      PT LONG TERM GOAL #1   Title  Patient demonstrates & verbalizes understanding of ongoing HEP / Fitness plan. (All LTGs Target Date: 10/21/2017)    Time  10    Period  Weeks    Status  New    Target Date  10/21/17      PT LONG TERM GOAL #2   Title  Berg Balance >36/56 to indicate lower fall risk.     Time  10    Period  Weeks    Status  New    Target Date  10/21/17      PT LONG TERM GOAL #3   Title  Timed Up & Go <13.5sec and Cognitive TUG <15.5sec to indicate lower fall risk.     Time  10    Period  Weeks    Status  New    Target Date  10/21/17      PT LONG TERM GOAL #4   Title  Dynamic Gait Index >/= 14/24 to indicate lower fall risk.     Time  10    Period  Weeks    Status  New    Target Date  10/21/17      PT LONG TERM GOAL #5   Title  Patient ambulates 500' outdoors with LRAD modified independent.     Time  10    Period  Weeks    Status  New    Target Date  10/21/17      Additional Long Term Goals   Additional Long Term Goals  Yes      PT LONG TERM GOAL #6   Title  Patient negotiates ramps,  curbs & stairs with LRAD modified independent.     Time  10    Period  Weeks    Status  New    Target Date  10/21/17            Plan - 09/08/17 1948    Clinical Impression Statement  Session continued to focus on BLE strength, low back strength, and balance.  Note improved quality of gait today however would still recommend she use AD in order to prevent gait compensations.     Rehab Potential  Good    Clinical Impairments Affecting Rehab Potential  lumbar stenosis, compression fractures, Crohn's colitis, diabetes, obesity, HTN, macular degeneration    PT Frequency  2x / week    PT Duration  Other (comment) 10 weeks    PT Treatment/Interventions   ADLs/Self Care Home Management;DME Instruction;Gait training;Stair training;Functional mobility training;Therapeutic activities;Therapeutic exercise;Balance training;Neuromuscular re-education;Patient/family education;Orthotic Fit/Training;Vestibular;Passive range of motion;Manual techniques    PT Next Visit Plan  Gait with cane vs rollator vs no AD, continue with core strength and BLE strengthening, endurance.     Consulted and Agree with Plan of Care  Patient       Patient will benefit from skilled therapeutic intervention in order to improve the following deficits and impairments:  Abnormal gait, Decreased activity tolerance, Decreased balance, Decreased coordination, Decreased endurance, Decreased mobility, Decreased knowledge of use of DME, Impaired flexibility, Decreased strength, Postural dysfunction, Obesity  Visit Diagnosis: Other abnormalities of gait and mobility  Unsteadiness on feet  Muscle weakness (generalized)     Problem List Patient Active Problem List   Diagnosis Date Noted  . Type 2 diabetes mellitus ([place]) 01/13/2017  . Subclavian arterial stenosis ([place]) 01/13/2017  . Iatrogenic Cushing's syndrome ([place]) 01/13/2017  . Essential hypertension 01/13/2017  . Crohn's disease of colon with rectal bleeding ([place]) 01/13/2017  . Regional enteritis of unspecified site 10/25/2011  . Family history of malignant neoplasm of gastrointestinal tract 09/17/2011    Cameron Sprang, PT, MPT [facility] [address] [place], [place], [address] Phone: [phone]   Fax:  [phone] 09/08/17, 7:51 PM  Name: Catherine Nicholson MRN: [id number] Date of Birth: [birth year]

## 2017-09-12 ENCOUNTER — Ambulatory Visit: Payer: BLUE CROSS/BLUE SHIELD | Admitting: Rehabilitation

## 2017-09-12 ENCOUNTER — Encounter: Payer: Self-pay | Admitting: Rehabilitation

## 2017-09-12 DIAGNOSIS — R2689 Other abnormalities of gait and mobility: Secondary | ICD-10-CM

## 2017-09-12 DIAGNOSIS — M6281 Muscle weakness (generalized): Secondary | ICD-10-CM

## 2017-09-12 DIAGNOSIS — R2681 Unsteadiness on feet: Secondary | ICD-10-CM

## 2017-09-12 NOTE — Therapy (Signed)
[facility] [address] [place], [place], [address] Phone: [phone]   Fax:  [phone]  Physical Therapy Treatment  Patient Details  Name: Catherine Nicholson MRN: [id number] Date of Birth: 26-Jan-1961 Referring Provider: Elsie Saas, MD   Encounter Date: 09/12/2017  PT End of Session - 09/12/17 2019    Visit Number  5    Number of Visits  20    Date for PT Re-Evaluation  10/14/17    Authorization Type  BCBS deductable MET, 20% co-insurance, 30 visit OT/PT comb limit    Authorization - Visit Number  5    Authorization - Number of Visits  30    PT Start Time  1532    PT Stop Time  1616    PT Time Calculation (min)  44 min    Equipment Utilized During Treatment  Gait belt    Activity Tolerance  Patient tolerated treatment well    Behavior During Therapy  WFL for tasks assessed/performed       Past Medical History:  Diagnosis Date  . Allergy   . Anemia   . Anxiety   . Cataract   . Clotting disorder ([place])   . Crohn's colitis ([place]) 08-2011   Colonoscopy  . Depression   . GERD (gastroesophageal reflux disease)   . Glaucoma   . Hemorrhoids 08-2011   Colonoscopy   . Hyperlipidemia   . Hypertension   . IBS (irritable bowel syndrome)   . PID (pelvic inflammatory disease) 1989   with HSG  . Regional enteritis of unspecified site 10/25/2011    Past Surgical History:  Procedure Laterality Date  . BREAST BIOPSY  06/2002   Fibroadenoma  . CAROTID ARTERY - SUBCLAVIAN ARTERY BYPASS GRAFT  2004  . CESAREAN SECTION  1991  . EYE SURGERY     bi-lat lens transplant  . NASAL SINUS SURGERY  1998   Reconstruction   . TONSILLECTOMY  1992  . TUBAL LIGATION      There were no vitals filed for this visit.  Subjective Assessment - 09/12/17 1542    Subjective  Pt reports soreness in backs of legs today.     Pertinent History  lumbar stenosis,Crohn's colitis, diabetes, obesity, HTN    Patient Stated Goals  Walk with no  problems    Currently in Pain?  Yes    Pain Score  5     Pain Location  Leg    Pain Orientation  Right;Left    Pain Descriptors / Indicators  Cramping;Tightness    Pain Type  Acute pain    Pain Onset  In the past 7 days    Pain Frequency  Intermittent    Aggravating Factors   exercises    Pain Relieving Factors  stretching                      [facility] Adult PT Treatment/Exercise - 09/12/17 1559      Exercises   Exercises  Other Exercises    Other Exercises   Side stepping in // bars with yellow theraband x 2 reps down and back progressing to 4 way hip exercise (x 10 reps each direction on each LE)  with UE support as needed with yellow theraband.  Max cues for posture and improved hip activation on stance leg.  Hamstring pulls while seated on stool x 2 sets of 10' with resisted backwards pushing to improve quad strength x 20', BLE bridging with hip abd  w/ yellow band x 10 reps, seated nustep x 3 mins with LEs only at level 3 resistance beginning with RPM in the 50's and last minute maintaining in the 60's.  Pt notably fatigued and needing several seated rest breaks.  Continue to recommend use of walker when pt fatigued and or sore to prevent gait compensations.               PT Education - 09/12/17 2018    Education provided  Yes    Education Details  using walker or cane when fatigued and/or sore to prevent gait compensations.     Person(s) Educated  Patient;Child(ren)    Methods  Explanation    Comprehension  Verbalized understanding       PT Short Term Goals - 08/08/17 2315      PT SHORT TERM GOAL #1   Title  Patient demonstrates understanding of initial HEP. (All STGs Target Date: 09/16/2017)    Time  5    Period  Weeks    Status  New    Target Date  09/16/17      PT SHORT TERM GOAL #2   Title  Time Up & Go <15sec    Time  5    Period  Weeks    Status  New    Target Date  09/16/17      PT SHORT TERM GOAL #3   Title  Berg Balance >/= 28/56    Time   5    Period  Weeks    Status  New    Target Date  09/16/17        PT Long Term Goals - 08/08/17 2306      PT LONG TERM GOAL #1   Title  Patient demonstrates & verbalizes understanding of ongoing HEP / Fitness plan. (All LTGs Target Date: 10/21/2017)    Time  10    Period  Weeks    Status  New    Target Date  10/21/17      PT LONG TERM GOAL #2   Title  Berg Balance >36/56 to indicate lower fall risk.     Time  10    Period  Weeks    Status  New    Target Date  10/21/17      PT LONG TERM GOAL #3   Title  Timed Up & Go <13.5sec and Cognitive TUG <15.5sec to indicate lower fall risk.     Time  10    Period  Weeks    Status  New    Target Date  10/21/17      PT LONG TERM GOAL #4   Title  Dynamic Gait Index >/= 14/24 to indicate lower fall risk.     Time  10    Period  Weeks    Status  New    Target Date  10/21/17      PT LONG TERM GOAL #5   Title  Patient ambulates 500' outdoors with LRAD modified independent.     Time  10    Period  Weeks    Status  New    Target Date  10/21/17      Additional Long Term Goals   Additional Long Term Goals  Yes      PT LONG TERM GOAL #6   Title  Patient negotiates ramps, curbs & stairs with LRAD modified independent.     Time  10    Period  Weeks  Status  New    Target Date  10/21/17            Plan - 09/12/17 2019    Clinical Impression Statement  Session continues to focus on BLE and core strength as well as endurance.  Pt making good progress towards goals.     Rehab Potential  Good    Clinical Impairments Affecting Rehab Potential  lumbar stenosis, compression fractures, Crohn's colitis, diabetes, obesity, HTN, macular degeneration    PT Frequency  2x / week    PT Duration  Other (comment) 10 weeks    PT Treatment/Interventions  ADLs/Self Care Home Management;DME Instruction;Gait training;Stair training;Functional mobility training;Therapeutic activities;Therapeutic exercise;Balance training;Neuromuscular  re-education;Patient/family education;Orthotic Fit/Training;Vestibular;Passive range of motion;Manual techniques    PT Next Visit Plan  Gait with cane vs rollator vs no AD, continue with core strength and BLE strengthening, endurance, balance.     Consulted and Agree with Plan of Care  Patient       Patient will benefit from skilled therapeutic intervention in order to improve the following deficits and impairments:  Abnormal gait, Decreased activity tolerance, Decreased balance, Decreased coordination, Decreased endurance, Decreased mobility, Decreased knowledge of use of DME, Impaired flexibility, Decreased strength, Postural dysfunction, Obesity  Visit Diagnosis: Other abnormalities of gait and mobility  Unsteadiness on feet  Muscle weakness (generalized)     Problem List Patient Active Problem List   Diagnosis Date Noted  . Type 2 diabetes mellitus ([place]) 01/13/2017  . Subclavian arterial stenosis ([place]) 01/13/2017  . Iatrogenic Cushing's syndrome ([place]) 01/13/2017  . Essential hypertension 01/13/2017  . Crohn's disease of colon with rectal bleeding ([place]) 01/13/2017  . Regional enteritis of unspecified site 10/25/2011  . Family history of malignant neoplasm of gastrointestinal tract 09/17/2011    Cameron Sprang, PT, MPT [facility] [address] [place], [place], [address] Phone: [phone]   Fax:  [phone] 09/12/17, 8:22 PM  Name: Catherine Nicholson MRN: [id number] Date of Birth: 07-25-1961

## 2017-09-15 ENCOUNTER — Ambulatory Visit: Payer: BLUE CROSS/BLUE SHIELD | Admitting: Rehabilitation

## 2017-09-20 ENCOUNTER — Encounter: Payer: Self-pay | Admitting: Physical Therapy

## 2017-09-20 ENCOUNTER — Ambulatory Visit: Payer: BLUE CROSS/BLUE SHIELD | Admitting: Physical Therapy

## 2017-09-20 DIAGNOSIS — R2689 Other abnormalities of gait and mobility: Secondary | ICD-10-CM

## 2017-09-20 DIAGNOSIS — M6281 Muscle weakness (generalized): Secondary | ICD-10-CM

## 2017-09-20 DIAGNOSIS — M21371 Foot drop, right foot: Secondary | ICD-10-CM

## 2017-09-20 DIAGNOSIS — R2681 Unsteadiness on feet: Secondary | ICD-10-CM

## 2017-09-21 NOTE — Therapy (Signed)
[facility] [address] [place], [place], [address] Phone: [phone]   Fax:  [phone]  Physical Therapy Treatment  Patient Details  Name: Catherine Nicholson MRN: [id number] Date of Birth: [birth year] Referring Provider: Elsie Saas, MD   Encounter Date: 09/20/2017  PT End of Session - 09/20/17 1911    Visit Number  6    Number of Visits  20    Date for PT Re-Evaluation  10/14/17    Authorization Type  BCBS deductable MET, 20% co-insurance, 30 visit OT/PT comb limit    Authorization - Visit Number  6    Authorization - Number of Visits  30    PT Start Time  2694    PT Stop Time  1620    PT Time Calculation (min)  49 min    Equipment Utilized During Treatment  Gait belt    Activity Tolerance  Patient tolerated treatment well    Behavior During Therapy  WFL for tasks assessed/performed       Past Medical History:  Diagnosis Date  . Allergy   . Anemia   . Anxiety   . Cataract   . Clotting disorder ([place])   . Crohn's colitis ([place]) 08-2011   Colonoscopy  . Depression   . GERD (gastroesophageal reflux disease)   . Glaucoma   . Hemorrhoids 08-2011   Colonoscopy   . Hyperlipidemia   . Hypertension   . IBS (irritable bowel syndrome)   . PID (pelvic inflammatory disease) 1989   with HSG  . Regional enteritis of unspecified site 10/25/2011    Past Surgical History:  Procedure Laterality Date  . BREAST BIOPSY  06/2002   Fibroadenoma  . CAROTID ARTERY - SUBCLAVIAN ARTERY BYPASS GRAFT  2004  . CESAREAN SECTION  1991  . EYE SURGERY     bi-lat lens transplant  . NASAL SINUS SURGERY  1998   Reconstruction   . TONSILLECTOMY  1992  . TUBAL LIGATION      There were no vitals filed for this visit.  Subjective Assessment - 09/20/17 1530    Subjective  No falls. The soreness in back of legs.     Pertinent History  lumbar stenosis,Crohn's colitis, diabetes, obesity, HTN    Patient Stated Goals  Walk with no  problems    Currently in Pain?  Yes    Pain Score  4     Pain Location  Leg    Pain Orientation  Right;Left    Pain Descriptors / Indicators  Tightness    Pain Onset  In the past 7 days    Pain Frequency  Intermittent    Aggravating Factors   activity changes     Pain Relieving Factors  stretching         [facility] PT Assessment - 09/20/17 1530      Berg Balance Test   Sit to Stand  Able to stand  independently using hands    Standing Unsupported  Able to stand safely 2 minutes    Sitting with Back Unsupported but Feet Supported on Floor or Stool  Able to sit safely and securely 2 minutes    Stand to Sit  Sits safely with minimal use of hands    Transfers  Able to transfer safely, minor use of hands    Standing Unsupported with Eyes Closed  Able to stand 10 seconds with supervision    Standing Ubsupported with Feet Together  Able to place feet together independently but  unable to hold for 30 seconds    From Standing, Reach Forward with Outstretched Arm  Can reach forward >12 cm safely (5")    From Standing Position, Pick up Object from Floor  Unable to try/needs assist to keep balance    From Standing Position, Turn to Look Behind Over each Shoulder  Needs supervision when turning    Turn 360 Degrees  Needs close supervision or verbal cueing    Standing Unsupported, Alternately Place Feet on Step/Stool  Able to complete >2 steps/needs minimal assist    Standing Unsupported, One Foot in [place] to take small step independently and hold 30 seconds    Standing on One Leg  Tries to lift leg/unable to hold 3 seconds but remains standing independently    Total Score  33    Berg comment:  Initial Berg 20/56      Timed Up and Go Test   Normal TUG (seconds)  14.94 14.94sec 1st & 12.72sec 2nd attempt, Initial was 16.16sec              PT reviewed the HEP following with minor corrections to facilitate core stabilization. See bold comments.  PT demo, verbal cues & pt verbalized  understanding.     PELVIC TILT: Posterior    Tighten abdominals, flatten low back. Hold for 5 secs.  _10__ reps per set, _2__ sets per day, __5_ days per week   Copyright  VHI. All rights reserved.   Hamstring Stretch    Sitting with operated leg straight on bed, and foot of other leg on floor, lean forward toward toes of straight leg. Hold _60___ seconds. Repeat __2-3__ times. Do __2_ sessions per day.  http://gt2.exer.us/303    Alternative sitting with leg extended if not near bed or couch.    Copyright  VHI. All rights reserved.      Core hooklying marching opposite leg  Lying on your back with knees bent, slowly lift opposite foot 6 inches off the surface, keeping knee bent. Slowly return foot to surface.  Perform posterior pelvic tilt prior to movement. March one time per leg. Then reset tilt.   Knee to Chest    Lying supine, bend involved knee to chest _2-3__ times. Hold for 30-60 secs.  Repeat with other leg. Do _2-3__ times per day.  Copyright  VHI. All rights reserved.    Abduction: Clam (Eccentric) - Side-Lying    Lie on side with knees bent. Lift top knee, keeping feet together. Keep trunk steady. Slowly lower for 3-5 seconds. _10__ reps per set, _2__ sets per day, _5-7__ days per week. Add _1-2 lbs when you achieve _10-15__ repetitions and you no longer feel its challenging.   http://ecce.exer.us/65   Maintaining sidelying without trunk /pelvic rotation for movement.   Copyright  VHI. All rights reserved.   Straight Leg Raise    Tighten stomach and slowly raise locked right leg to the height of the other knee.  Repeat __5-8__ times per set. Do _1___ sets per session. Do _2__ sessions per day.  Once you are able to do 10 reps fairly easily, add 1-2 pound ankle weight.    http://orth.exer.us/1103   PT instructed in quad set and performing prior to lifting. Only raise as high as can maintain knee extension & control.    Copyright  VHI. All rights reserved.       WALKING  Walking is a great form of exercise to increase your strength, endurance and overall fitness.  A  walking program can help you start slowly and gradually build endurance as you go.  Everyone's ability is different, so each person's starting point will be different.  You do not have to follow them exactly.  The are just samples. You should simply find out what's right for you and stick to that program.   In the beginning, you'll start off walking 2-3 times a day for short distances.  As you get stronger, you'll be walking further at just 1-2 times per day.   Using rollator walker to facilitate posture, light UE support, longer tolerable times, seat to rest and ability to perform 2-3 sets with out & back.   A.         You Can Walk For A Certain Length Of Time Each Day                          Walk 2-3 minutes 4-5 times per day.             Increase 1 minutes every 7 days (3 times per day).             Work up to 25-30 minutes (1-2 times per day).   B.         You Can Walk For a Certain Distance Each Day                                      Distance can be substituted for time.                          Example:                         3 trips to mailbox (at road)                         3 trips to corner of block                         3 trips around the block  C.         Go to local high school and use the track.                          Walk for distance ____ around track             Or time ____ minutes  Please only do the exercises that your therapist has initialed and dated    Patient's daughter to look into gym facilities in their community for seated back support (recumbent) that uses BUEs & BLEs to operate like NuStep or SciFit stepper.       PT Education - 09/20/17 1600    Education provided  Yes    Education Details  reviewed HEP using Epic.     Person(s) Educated  Patient;Child(ren)     Methods  Explanation;Demonstration;Tactile cues;Verbal cues    Comprehension  Verbalized understanding       PT Short Term Goals - 09/20/17 1912      PT SHORT TERM GOAL #1   Title  Patient demonstrates understanding of initial HEP. (All STGs Target Date: 09/16/2017)    Baseline  MET 09/20/2017    Time  5  Period  Weeks    Status  Achieved      PT SHORT TERM GOAL #2   Title  Time Up & Go <15sec    Baseline  MET 09/20/2017  TUG improved to 14.94sec with cane from 16.16sec    Time  5    Period  Weeks    Status  Achieved      PT SHORT TERM GOAL #3   Title  Berg Balance >/= 28/56    Baseline  MET 09/20/2017  Berg Balance improved to 33/56 from 20/56    Time  5    Period  Weeks    Status  Achieved        PT Long Term Goals - 09/20/17 1914      PT LONG TERM GOAL #1   Title  Patient demonstrates & verbalizes understanding of ongoing HEP / Fitness plan. (All LTGs Target Date: 10/21/2017)    Time  10    Period  Weeks    Status  On-going    Target Date  10/21/17      PT LONG TERM GOAL #2   Title  Berg Balance >38/56 to indicate lower fall risk.     Time  10    Period  Weeks    Status  Revised    Target Date  10/21/17      PT LONG TERM GOAL #3   Title  Timed Up & Go <13.5sec and Cognitive TUG <15.5sec to indicate lower fall risk.     Time  10    Period  Weeks    Status  On-going    Target Date  10/21/17      PT LONG TERM GOAL #4   Title  Dynamic Gait Index >/= 14/24 to indicate lower fall risk.     Time  10    Period  Weeks    Status  On-going    Target Date  10/21/17      PT LONG TERM GOAL #5   Title  Patient ambulates 500' outdoors with LRAD modified independent.     Time  10    Period  Weeks    Status  On-going    Target Date  10/21/17      PT LONG TERM GOAL #6   Title  Patient negotiates ramps, curbs & stairs with LRAD modified independent.     Time  10    Period  Weeks    Status  On-going    Target Date  10/21/17            Plan - 09/20/17 1915     Clinical Impression Statement  Patient met all STGs. Patient verbalizes better understanding of HEP and reports she can already see improvements.     Rehab Potential  Good    Clinical Impairments Affecting Rehab Potential  lumbar stenosis, compression fractures, Crohn's colitis, diabetes, obesity, HTN, macular degeneration    PT Frequency  2x / week    PT Duration  Other (comment) 10 weeks    PT Treatment/Interventions  ADLs/Self Care Home Management;DME Instruction;Gait training;Stair training;Functional mobility training;Therapeutic activities;Therapeutic exercise;Balance training;Neuromuscular re-education;Patient/family education;Orthotic Fit/Training;Vestibular;Passive range of motion;Manual techniques    PT Next Visit Plan  continue with core strength and BLE strengthening, endurance, balance.     Consulted and Agree with Plan of Care  Patient       Patient will benefit from skilled therapeutic intervention in order to improve the following deficits and impairments:  Abnormal gait, Decreased  activity tolerance, Decreased balance, Decreased coordination, Decreased endurance, Decreased mobility, Decreased knowledge of use of DME, Impaired flexibility, Decreased strength, Postural dysfunction, Obesity  Visit Diagnosis: Other abnormalities of gait and mobility  Unsteadiness on feet  Muscle weakness (generalized)  Foot drop, right     Problem List Patient Active Problem List   Diagnosis Date Noted  . Type 2 diabetes mellitus ([place]) 01/13/2017  . Subclavian arterial stenosis ([place]) 01/13/2017  . Iatrogenic Cushing's syndrome ([place]) 01/13/2017  . Essential hypertension 01/13/2017  . Crohn's disease of colon with rectal bleeding ([place]) 01/13/2017  . Regional enteritis of unspecified site 10/25/2011  . Family history of malignant neoplasm of gastrointestinal tract 09/17/2011    Jamey Reas PT, DPT 09/21/2017, 9:18 AM  [facility] [address] [place], [place], [address] Phone: [phone]   Fax:  [phone]  Name: Catherine Nicholson MRN: [id number] Date of Birth: [birth year]

## 2017-09-22 ENCOUNTER — Encounter: Payer: Self-pay | Admitting: Rehabilitation

## 2017-09-22 ENCOUNTER — Ambulatory Visit: Payer: BLUE CROSS/BLUE SHIELD | Admitting: Rehabilitation

## 2017-09-22 DIAGNOSIS — R2689 Other abnormalities of gait and mobility: Secondary | ICD-10-CM

## 2017-09-22 DIAGNOSIS — M6281 Muscle weakness (generalized): Secondary | ICD-10-CM

## 2017-09-22 DIAGNOSIS — R2681 Unsteadiness on feet: Secondary | ICD-10-CM

## 2017-09-22 NOTE — Therapy (Signed)
[facility] [address] [place], [place], [address] Phone: [phone]   Fax:  [phone]  Physical Therapy Treatment  Patient Details  Name: Catherine Nicholson MRN: [id number] Date of Birth: [birth year] Referring Provider: Elsie Saas, MD   Encounter Date: 09/22/2017  PT End of Session - 09/22/17 1621    Visit Number  7    Number of Visits  20    Date for PT Re-Evaluation  10/14/17    Authorization Type  BCBS deductable MET, 20% co-insurance, 30 visit OT/PT comb limit    Authorization - Visit Number  7    Authorization - Number of Visits  30    PT Start Time  2423    PT Stop Time  1700    PT Time Calculation (min)  43 min    Equipment Utilized During Treatment  Gait belt    Activity Tolerance  Patient tolerated treatment well    Behavior During Therapy  WFL for tasks assessed/performed       Past Medical History:  Diagnosis Date  . Allergy   . Anemia   . Anxiety   . Cataract   . Clotting disorder ([place])   . Crohn's colitis ([place]) 08-2011   Colonoscopy  . Depression   . GERD (gastroesophageal reflux disease)   . Glaucoma   . Hemorrhoids 08-2011   Colonoscopy   . Hyperlipidemia   . Hypertension   . IBS (irritable bowel syndrome)   . PID (pelvic inflammatory disease) 1989   with HSG  . Regional enteritis of unspecified site 10/25/2011    Past Surgical History:  Procedure Laterality Date  . BREAST BIOPSY  06/2002   Fibroadenoma  . CAROTID ARTERY - SUBCLAVIAN ARTERY BYPASS GRAFT  2004  . CESAREAN SECTION  1991  . EYE SURGERY     bi-lat lens transplant  . NASAL SINUS SURGERY  1998   Reconstruction   . TONSILLECTOMY  1992  . TUBAL LIGATION      There were no vitals filed for this visit.  Subjective Assessment - 09/22/17 1619    Subjective  No changes to report, no falls, having increased pain in back today.     Currently in Pain?  Yes    Pain Score  7     Pain Location  Back    Pain Orientation   Right;Left;Lower    Pain Descriptors / Indicators  Sore    Pain Type  Acute pain    Pain Radiating Towards  into hips    Pain Onset  Yesterday    Pain Frequency  Intermittent    Aggravating Factors   nothing    Pain Relieving Factors  sitting                      [facility] Adult PT Treatment/Exercise - 09/22/17 0001      Exercises   Exercises  Other Exercises    Other Exercises   In // bars with UE support, working on heel raises x 10 reps progressing to standing on small rocker board moving board forward/backwards with emphasis on ankle PF to carryover to improved push off during gait (x 10 reps), had pt stand on small half foam roll working on "rolling" foot from heel to toe while she advanced and retrostepped opposite LE (again to carryover to improved gait pattern) x 10 reps on each side.  Mini squats in // bars with BUE>single UE support x 10 reps (cues for  technique).  Then had pt work in quadruped alternating LE extension x 10 reps>alternating UE/LE extension x 10 reps with tactile cues for decreased lateral movement and improved core activation.  Continued with BLE strength with transitions from tall kneeling>half kneeling>standing and vice versa x 3 reps on each side with UE support.  Note that she needs increased assist when pushing up with LLE.  Ended session performing stairs for BLE strengthening.  Pt able to perform x 3 reps in alternating pattern with BUE support.  Provided cues for increased LE activation rather than pulling with UEs.  Recommend she practice stairs at home x 2 reps per day.  Pt verbalized understanding.              PT Education - 09/22/17 1924    Education provided  Yes    Education Details  doing stairs in home x 2 reps per day for LE strengthening    Person(s) Educated  Child(ren);Patient    Methods  Explanation    Comprehension  Verbalized understanding       PT Short Term Goals - 09/20/17 1912      PT SHORT TERM GOAL #1   Title   Patient demonstrates understanding of initial HEP. (All STGs Target Date: 09/16/2017)    Baseline  MET 09/20/2017    Time  5    Period  Weeks    Status  Achieved      PT SHORT TERM GOAL #2   Title  Time Up & Go <15sec    Baseline  MET 09/20/2017  TUG improved to 14.94sec with cane from 16.16sec    Time  5    Period  Weeks    Status  Achieved      PT SHORT TERM GOAL #3   Title  Berg Balance >/= 28/56    Baseline  MET 09/20/2017  Berg Balance improved to 33/56 from 20/56    Time  5    Period  Weeks    Status  Achieved        PT Long Term Goals - 09/20/17 1914      PT LONG TERM GOAL #1   Title  Patient demonstrates & verbalizes understanding of ongoing HEP / Fitness plan. (All LTGs Target Date: 10/21/2017)    Time  10    Period  Weeks    Status  On-going    Target Date  10/21/17      PT LONG TERM GOAL #2   Title  Berg Balance >38/56 to indicate lower fall risk.     Time  10    Period  Weeks    Status  Revised    Target Date  10/21/17      PT LONG TERM GOAL #3   Title  Timed Up & Go <13.5sec and Cognitive TUG <15.5sec to indicate lower fall risk.     Time  10    Period  Weeks    Status  On-going    Target Date  10/21/17      PT LONG TERM GOAL #4   Title  Dynamic Gait Index >/= 14/24 to indicate lower fall risk.     Time  10    Period  Weeks    Status  On-going    Target Date  10/21/17      PT LONG TERM GOAL #5   Title  Patient ambulates 500' outdoors with LRAD modified independent.     Time  10    Period  Weeks    Status  On-going    Target Date  10/21/17      PT LONG TERM GOAL #6   Title  Patient negotiates ramps, curbs & stairs with LRAD modified independent.     Time  10    Period  Weeks    Status  On-going    Target Date  10/21/17            Plan - 09/22/17 1621    Clinical Impression Statement  Continue to focus on BLE and core strengthening.  Pt continues to demonstrate progress towards LTGs and note improved gait pattern.     Rehab Potential   Good    Clinical Impairments Affecting Rehab Potential  lumbar stenosis, compression fractures, Crohn's colitis, diabetes, obesity, HTN, macular degeneration    PT Frequency  2x / week    PT Duration  Other (comment) 10 weeks    PT Treatment/Interventions  ADLs/Self Care Home Management;DME Instruction;Gait training;Stair training;Functional mobility training;Therapeutic activities;Therapeutic exercise;Balance training;Neuromuscular re-education;Patient/family education;Orthotic Fit/Training;Vestibular;Passive range of motion;Manual techniques    PT Next Visit Plan  continue with core strength and BLE strengthening, endurance, balance.     Consulted and Agree with Plan of Care  Patient       Patient will benefit from skilled therapeutic intervention in order to improve the following deficits and impairments:  Abnormal gait, Decreased activity tolerance, Decreased balance, Decreased coordination, Decreased endurance, Decreased mobility, Decreased knowledge of use of DME, Impaired flexibility, Decreased strength, Postural dysfunction, Obesity  Visit Diagnosis: Other abnormalities of gait and mobility  Unsteadiness on feet  Muscle weakness (generalized)     Problem List Patient Active Problem List   Diagnosis Date Noted  . Type 2 diabetes mellitus ([place]) 01/13/2017  . Subclavian arterial stenosis ([place]) 01/13/2017  . Iatrogenic Cushing's syndrome ([place]) 01/13/2017  . Essential hypertension 01/13/2017  . Crohn's disease of colon with rectal bleeding ([place]) 01/13/2017  . Regional enteritis of unspecified site 10/25/2011  . Family history of malignant neoplasm of gastrointestinal tract 09/17/2011    Cameron Sprang, PT, MPT [facility] [address] [place], [place], [address] Phone: [phone]   Fax:  [phone] 09/22/17, 7:29 PM  Name: Catherine Nicholson MRN: [id number] Date of Birth: [birth year]

## 2017-09-26 ENCOUNTER — Ambulatory Visit: Payer: BLUE CROSS/BLUE SHIELD | Admitting: Physical Therapy

## 2017-09-29 ENCOUNTER — Encounter: Payer: Self-pay | Admitting: Rehabilitation

## 2017-09-29 ENCOUNTER — Ambulatory Visit: Payer: BLUE CROSS/BLUE SHIELD | Attending: Orthopedic Surgery | Admitting: Rehabilitation

## 2017-09-29 DIAGNOSIS — M6281 Muscle weakness (generalized): Secondary | ICD-10-CM | POA: Diagnosis present

## 2017-09-29 DIAGNOSIS — R2681 Unsteadiness on feet: Secondary | ICD-10-CM | POA: Diagnosis present

## 2017-09-29 DIAGNOSIS — R2689 Other abnormalities of gait and mobility: Secondary | ICD-10-CM

## 2017-09-29 NOTE — Therapy (Signed)
[facility] [address] [place], [place], [address] Phone: [phone]   Fax:  [phone]  Physical Therapy Treatment  Patient Details  Name: Catherine Nicholson MRN: [id number] Date of Birth: 06/10/1961 Referring Provider: Elsie Saas, MD   Encounter Date: 09/29/2017  PT End of Session - 09/29/17 2036    Visit Number  8    Number of Visits  20    Date for PT Re-Evaluation  10/14/17    Authorization Type  BCBS deductable MET, 20% co-insurance, 30 visit OT/PT comb limit    Authorization - Visit Number  8    Authorization - Number of Visits  30    PT Start Time  7711 PT late from previous appt    PT Stop Time  1702    PT Time Calculation (min)  38 min    Equipment Utilized During Treatment  Gait belt    Activity Tolerance  Patient tolerated treatment well    Behavior During Therapy  WFL for tasks assessed/performed       Past Medical History:  Diagnosis Date  . Allergy   . Anemia   . Anxiety   . Cataract   . Clotting disorder ([place])   . Crohn's colitis ([place]) 08-2011   Colonoscopy  . Depression   . GERD (gastroesophageal reflux disease)   . Glaucoma   . Hemorrhoids 08-2011   Colonoscopy   . Hyperlipidemia   . Hypertension   . IBS (irritable bowel syndrome)   . PID (pelvic inflammatory disease) 1989   with HSG  . Regional enteritis of unspecified site 10/25/2011    Past Surgical History:  Procedure Laterality Date  . BREAST BIOPSY  06/2002   Fibroadenoma  . CAROTID ARTERY - SUBCLAVIAN ARTERY BYPASS GRAFT  2004  . CESAREAN SECTION  1991  . EYE SURGERY     bi-lat lens transplant  . NASAL SINUS SURGERY  1998   Reconstruction   . TONSILLECTOMY  1992  . TUBAL LIGATION      There were no vitals filed for this visit.  Subjective Assessment - 09/29/17 1629    Subjective  Reports some pain in hip, no other changes.     Pertinent History  lumbar stenosis,Crohn's colitis, diabetes, obesity, HTN    Patient  Stated Goals  Walk with no problems    Currently in Pain?  Yes    Pain Score  3     Pain Location  Hip    Pain Orientation  Left    Pain Descriptors / Indicators  Sore    Pain Type  Acute pain    Pain Onset  1 to 4 weeks ago    Pain Frequency  Intermittent    Aggravating Factors   standing long periods of time    Pain Relieving Factors  sitting                       [facility] Adult PT Treatment/Exercise - 09/29/17 1651      Neuro Re-ed    Neuro Re-ed Details   Counter top balance: marching forwards/backards slowly to increase SLS x 2 reps forwards and backwards, tandem walking x 2 reps forwards and backwards each.        Exercises   Exercises  Other Exercises    Other Exercises   Seated nustep x 4 mins (consecutively without break) at level 3 resistance with LEs only maintaining 50's steps per minute.  Ascending and descending stairs  sideways with use of single rail to ensure safety at home as she only has one rail.  Performed racing both R and L to ensure that both legs had challenge.  Note marked challenge on the RLE leading, therefore recommend S with this at home. Wall bumps x 5 reps with feet apart on ground>5 reps with feet together and ending with feet apart on foam balance beam to increase use of hip extension and to improve pts ability to feel having hips under LEs in stance to carry over to gait.               PT Education - 09/29/17 1701    Education provided  Yes    Education Details  doing stairs sideways with rail and S from daughter    Person(s) Educated  Patient;Child(ren)    Methods  Explanation    Comprehension  Verbalized understanding       PT Short Term Goals - 09/20/17 1912      PT SHORT TERM GOAL #1   Title  Patient demonstrates understanding of initial HEP. (All STGs Target Date: 09/16/2017)    Baseline  MET 09/20/2017    Time  5    Period  Weeks    Status  Achieved      PT SHORT TERM GOAL #2   Title  Time Up & Go <15sec    Baseline   MET 09/20/2017  TUG improved to 14.94sec with cane from 16.16sec    Time  5    Period  Weeks    Status  Achieved      PT SHORT TERM GOAL #3   Title  Berg Balance >/= 28/56    Baseline  MET 09/20/2017  Berg Balance improved to 33/56 from 20/56    Time  5    Period  Weeks    Status  Achieved        PT Long Term Goals - 09/20/17 1914      PT LONG TERM GOAL #1   Title  Patient demonstrates & verbalizes understanding of ongoing HEP / Fitness plan. (All LTGs Target Date: 10/21/2017)    Time  10    Period  Weeks    Status  On-going    Target Date  10/21/17      PT LONG TERM GOAL #2   Title  Berg Balance >38/56 to indicate lower fall risk.     Time  10    Period  Weeks    Status  Revised    Target Date  10/21/17      PT LONG TERM GOAL #3   Title  Timed Up & Go <13.5sec and Cognitive TUG <15.5sec to indicate lower fall risk.     Time  10    Period  Weeks    Status  On-going    Target Date  10/21/17      PT LONG TERM GOAL #4   Title  Dynamic Gait Index >/= 14/24 to indicate lower fall risk.     Time  10    Period  Weeks    Status  On-going    Target Date  10/21/17      PT LONG TERM GOAL #5   Title  Patient ambulates 500' outdoors with LRAD modified independent.     Time  10    Period  Weeks    Status  On-going    Target Date  10/21/17      PT LONG TERM GOAL #  6   Title  Patient negotiates ramps, curbs & stairs with LRAD modified independent.     Time  10    Period  Weeks    Status  On-going    Target Date  10/21/17            Plan - 09/29/17 2037    Clinical Impression Statement  Continue to focus on BLE strengthening, endurance and balance.  Provided pt with wall bumps to perform at home to improve postural alignment in stance and during gait.  Pt making steady progress towards goals.      Rehab Potential  Good    Clinical Impairments Affecting Rehab Potential  lumbar stenosis, compression fractures, Crohn's colitis, diabetes, obesity, HTN, macular degeneration     PT Frequency  2x / week    PT Duration  Other (comment) 10 weeks    PT Treatment/Interventions  ADLs/Self Care Home Management;DME Instruction;Gait training;Stair training;Functional mobility training;Therapeutic activities;Therapeutic exercise;Balance training;Neuromuscular re-education;Patient/family education;Orthotic Fit/Training;Vestibular;Passive range of motion;Manual techniques    PT Next Visit Plan  continue with core strength and BLE strengthening, endurance, balance.     Consulted and Agree with Plan of Care  Patient       Patient will benefit from skilled therapeutic intervention in order to improve the following deficits and impairments:  Abnormal gait, Decreased activity tolerance, Decreased balance, Decreased coordination, Decreased endurance, Decreased mobility, Decreased knowledge of use of DME, Impaired flexibility, Decreased strength, Postural dysfunction, Obesity  Visit Diagnosis: Other abnormalities of gait and mobility  Unsteadiness on feet  Muscle weakness (generalized)     Problem List Patient Active Problem List   Diagnosis Date Noted  . Type 2 diabetes mellitus ([place]) 01/13/2017  . Subclavian arterial stenosis ([place]) 01/13/2017  . Iatrogenic Cushing's syndrome ([place]) 01/13/2017  . Essential hypertension 01/13/2017  . Crohn's disease of colon with rectal bleeding ([place]) 01/13/2017  . Regional enteritis of unspecified site 10/25/2011  . Family history of malignant neoplasm of gastrointestinal tract 09/17/2011    Cameron Sprang, PT, MPT [facility] [address] [place], [place], [address] Phone: [phone]   Fax:  [phone] 09/29/17, 8:39 PM  Name: Serita Degroote MRN: [id number] Date of Birth: [birth year]

## 2017-09-29 NOTE — Patient Instructions (Signed)
Weight Shift: Anterior / Posterior (Righting / Equilibrium)    BEGIN WITH BACK LEANING AGAINST THE WALL AND FEET 4 INCHES AWAY.  Stand on pillow for increased challenge and place chair in front of you for safety.  Move your hips off the wall and come to upright standing.  Hold for 5 seconds. Return slowly to the wall letting your hips bump the wall and return to stand.   Hold each position _5___ seconds. Repeat _10__ times per session. Do _1-2__ sessions per day.

## 2017-10-03 ENCOUNTER — Encounter: Payer: Self-pay | Admitting: Rehabilitation

## 2017-10-03 ENCOUNTER — Ambulatory Visit: Payer: BLUE CROSS/BLUE SHIELD | Admitting: Rehabilitation

## 2017-10-03 DIAGNOSIS — M6281 Muscle weakness (generalized): Secondary | ICD-10-CM

## 2017-10-03 DIAGNOSIS — R2689 Other abnormalities of gait and mobility: Secondary | ICD-10-CM

## 2017-10-03 DIAGNOSIS — R2681 Unsteadiness on feet: Secondary | ICD-10-CM

## 2017-10-03 NOTE — Patient Instructions (Addendum)
      Hooklying Bridge with band  Raise your hips while maintaining a posterior pelvic tilt, abdominal draw in and pelvic floor tightening.  While elevated, push out against the band and hold. Do 10 reps, 1-2 times per day.    Copyright  VHI. All rights reserved.  Hamstring Stretch    Sitting with operated leg straight on bed, and foot of other leg on floor, lean forward toward toes of straight leg. Hold _60___ seconds. Repeat __2-3__ times. Do __2_ sessions per day.  http://gt2.exer.us/303      Bicycle with Bent Knees  - Have the patient lay in a supine position on a comfortable, flat surface with the neck and head in a neutral position.  - Bend both knees and allow the feet to rest flat on the surface.  - Bring one knee up towards the chest, with the opposite elbow crossing over the chest to meet at the knee.  - Keep the head in neutral and avoid raising (use a pillow if needed).  - Bring the leg back down to its original position.  - Do 10 at one time, repeat x 2-3 reps.      Knee to Chest    Lying supine, bend involved knee to chest _2-3__ times. Hold for 30-60 secs. Repeat with other leg. Do _2-3__ times per day.  Copyright  VHI. All rights reserved.   Abduction: Clam (Eccentric) - Side-Lying    Lie on side with knees bent. Lift top knee, keeping feet together. Keep trunk steady. Slowly lower for 3-5 seconds. _10__ reps per set, _2__ sets per day, _5-7__ days per week. Add red band around knees to increase challenge.    http://ecce.exer.us/65  Maintaining sidelying without trunk /pelvic rotation for movement.   Copyright  VHI. All rights reserved.  Straight Leg Raise    Tighten stomach and slowly raise locked right leg to the height of the other knee. Repeat __5-8__ times per set. Do _1___ sets per session. Do _2__ sessions per day. Once you are able to do 10 reps fairly easily, add 1-2 pound ankle weight.    http://orth.exer.us/1103  PT instructed in quad set and performing prior to lifting. Only raise as high as can maintain knee extension & control.   Copyright  VHI. All rights reserved.

## 2017-10-04 NOTE — Therapy (Signed)
[facility] [address] [place], [place], [address] Phone: [phone]   Fax:  [phone]  Physical Therapy Treatment  Patient Details  Name: Catherine Nicholson MRN: [id number] Date of Birth: 1961-06-29 Referring Provider: Elsie Saas, MD   Encounter Date: 10/03/2017  PT End of Session - 10/04/17 0905    Visit Number  9    Number of Visits  20    Date for PT Re-Evaluation  10/14/17    Authorization Type  BCBS deductable MET, 20% co-insurance, 30 visit OT/PT comb limit    Authorization - Visit Number  9    Authorization - Number of Visits  30    PT Start Time  5573    PT Stop Time  1615    PT Time Calculation (min)  44 min    Equipment Utilized During Treatment  Gait belt    Activity Tolerance  Patient tolerated treatment well    Behavior During Therapy  WFL for tasks assessed/performed       Past Medical History:  Diagnosis Date  . Allergy   . Anemia   . Anxiety   . Cataract   . Clotting disorder ([place])   . Crohn's colitis ([place]) 08-2011   Colonoscopy  . Depression   . GERD (gastroesophageal reflux disease)   . Glaucoma   . Hemorrhoids 08-2011   Colonoscopy   . Hyperlipidemia   . Hypertension   . IBS (irritable bowel syndrome)   . PID (pelvic inflammatory disease) 1989   with HSG  . Regional enteritis of unspecified site 10/25/2011    Past Surgical History:  Procedure Laterality Date  . BREAST BIOPSY  06/2002   Fibroadenoma  . CAROTID ARTERY - SUBCLAVIAN ARTERY BYPASS GRAFT  2004  . CESAREAN SECTION  1991  . EYE SURGERY     bi-lat lens transplant  . NASAL SINUS SURGERY  1998   Reconstruction   . TONSILLECTOMY  1992  . TUBAL LIGATION      There were no vitals filed for this visit.  Subjective Assessment - 10/03/17 1535    Subjective  Reports no changes, no falls.      Pertinent History  lumbar stenosis,Crohn's colitis, diabetes, obesity, HTN    Patient Stated Goals  Walk with no problems    Currently in Pain?  No/denies                      [facility] Adult PT Treatment/Exercise - 10/04/17 0001      Exercises   Exercises  Other Exercises    Other Exercises   Went through current HEP and updated/modified as needed, see pt instruction for details on exercises and reps performed.  Performed quadruped alternating LEs x 10 reps progressing to alternating UE/LE x 10 reps for improved core activation.  Prone on elbows elevating into plank x 5 reps with 5 sec holds and 1 rep with 8 sec hold.  Performed stairs x 2 reps for BLE strengthening (ascending sideways facing R during first rep and L during second rep).  Ended session with seated nustep x 5 mins at level 3 resistance with LEs only maintaining reps per minute in 50's (last minute in the 60's).  Pt tolerated all very well and is making excellent progress.               PT Education - 10/04/17 0905    Education provided  Yes    Education Details  updated HEP  Person(s) Educated  Patient;Child(ren)    Methods  Explanation;Demonstration;Handout    Comprehension  Verbalized understanding;Returned demonstration       PT Short Term Goals - 09/20/17 1912      PT SHORT TERM GOAL #1   Title  Patient demonstrates understanding of initial HEP. (All STGs Target Date: 09/16/2017)    Baseline  MET 09/20/2017    Time  5    Period  Weeks    Status  Achieved      PT SHORT TERM GOAL #2   Title  Time Up & Go <15sec    Baseline  MET 09/20/2017  TUG improved to 14.94sec with cane from 16.16sec    Time  5    Period  Weeks    Status  Achieved      PT SHORT TERM GOAL #3   Title  Berg Balance >/= 28/56    Baseline  MET 09/20/2017  Berg Balance improved to 33/56 from 20/56    Time  5    Period  Weeks    Status  Achieved        PT Long Term Goals - 09/20/17 1914      PT LONG TERM GOAL #1   Title  Patient demonstrates & verbalizes understanding of ongoing HEP / Fitness plan. (All LTGs Target Date: 10/21/2017)    Time  10     Period  Weeks    Status  On-going    Target Date  10/21/17      PT LONG TERM GOAL #2   Title  Berg Balance >38/56 to indicate lower fall risk.     Time  10    Period  Weeks    Status  Revised    Target Date  10/21/17      PT LONG TERM GOAL #3   Title  Timed Up & Go <13.5sec and Cognitive TUG <15.5sec to indicate lower fall risk.     Time  10    Period  Weeks    Status  On-going    Target Date  10/21/17      PT LONG TERM GOAL #4   Title  Dynamic Gait Index >/= 14/24 to indicate lower fall risk.     Time  10    Period  Weeks    Status  On-going    Target Date  10/21/17      PT LONG TERM GOAL #5   Title  Patient ambulates 500' outdoors with LRAD modified independent.     Time  10    Period  Weeks    Status  On-going    Target Date  10/21/17      PT LONG TERM GOAL #6   Title  Patient negotiates ramps, curbs & stairs with LRAD modified independent.     Time  10    Period  Weeks    Status  On-going    Target Date  10/21/17            Plan - 10/04/17 0905    Clinical Impression Statement  Session focused on going over current HEP and making updates as needed to increase challenge at home.  also continue to address BLE and core strength during session.  Pt making excellent progress towards goals.      Rehab Potential  Good    Clinical Impairments Affecting Rehab Potential  lumbar stenosis, compression fractures, Crohn's colitis, diabetes, obesity, HTN, macular degeneration    PT Frequency  2x / week  PT Duration  Other (comment) 10 weeks    PT Treatment/Interventions  ADLs/Self Care Home Management;DME Instruction;Gait training;Stair training;Functional mobility training;Therapeutic activities;Therapeutic exercise;Balance training;Neuromuscular re-education;Patient/family education;Orthotic Fit/Training;Vestibular;Passive range of motion;Manual techniques    PT Next Visit Plan  Robin, you see her both times next week (she missed some visits due to work conflicts)-I  can argue it either way as to whether extend her or D/C (see if she got into gym yet-this would help with decision) continue with core strength and BLE strengthening, endurance, balance.     Consulted and Agree with Plan of Care  Patient       Patient will benefit from skilled therapeutic intervention in order to improve the following deficits and impairments:  Abnormal gait, Decreased activity tolerance, Decreased balance, Decreased coordination, Decreased endurance, Decreased mobility, Decreased knowledge of use of DME, Impaired flexibility, Decreased strength, Postural dysfunction, Obesity  Visit Diagnosis: Other abnormalities of gait and mobility  Unsteadiness on feet  Muscle weakness (generalized)     Problem List Patient Active Problem List   Diagnosis Date Noted  . Type 2 diabetes mellitus ([place]) 01/13/2017  . Subclavian arterial stenosis ([place]) 01/13/2017  . Iatrogenic Cushing's syndrome ([place]) 01/13/2017  . Essential hypertension 01/13/2017  . Crohn's disease of colon with rectal bleeding ([place]) 01/13/2017  . Regional enteritis of unspecified site 10/25/2011  . Family history of malignant neoplasm of gastrointestinal tract 09/17/2011    Cameron Sprang, PT, MPT [facility] [address] [place], [place], [address] Phone: [phone]   Fax:  [phone] 10/04/17, 9:11 AM  Name: Catherine Nicholson MRN: [id number] Date of Birth: 01-06-1961

## 2017-10-06 ENCOUNTER — Encounter: Payer: Self-pay | Admitting: Rehabilitation

## 2017-10-06 ENCOUNTER — Ambulatory Visit: Payer: BLUE CROSS/BLUE SHIELD | Admitting: Rehabilitation

## 2017-10-06 DIAGNOSIS — R2681 Unsteadiness on feet: Secondary | ICD-10-CM

## 2017-10-06 DIAGNOSIS — M6281 Muscle weakness (generalized): Secondary | ICD-10-CM

## 2017-10-06 DIAGNOSIS — R2689 Other abnormalities of gait and mobility: Secondary | ICD-10-CM

## 2017-10-06 NOTE — Therapy (Signed)
[facility] [address] [place], [place], [address] Phone: [phone]   Fax:  [phone]  Physical Therapy Treatment  Patient Details  Name: Catherine Nicholson MRN: [id number] Date of Birth: [birth year] Referring Provider: Elsie Saas, MD   Encounter Date: 10/06/2017  PT End of Session - 10/06/17 1623    Visit Number  10    Number of Visits  20    Date for PT Re-Evaluation  10/14/17    Authorization Type  BCBS deductable MET, 20% co-insurance, 30 visit OT/PT comb limit    Authorization - Visit Number  10    Authorization - Number of Visits  30    PT Start Time  1616    PT Stop Time  1700    PT Time Calculation (min)  44 min    Equipment Utilized During Treatment  Gait belt    Activity Tolerance  Patient tolerated treatment well    Behavior During Therapy  WFL for tasks assessed/performed       Past Medical History:  Diagnosis Date  . Allergy   . Anemia   . Anxiety   . Cataract   . Clotting disorder ([place])   . Crohn's colitis ([place]) 08-2011   Colonoscopy  . Depression   . GERD (gastroesophageal reflux disease)   . Glaucoma   . Hemorrhoids 08-2011   Colonoscopy   . Hyperlipidemia   . Hypertension   . IBS (irritable bowel syndrome)   . PID (pelvic inflammatory disease) 1989   with HSG  . Regional enteritis of unspecified site 10/25/2011    Past Surgical History:  Procedure Laterality Date  . BREAST BIOPSY  06/2002   Fibroadenoma  . CAROTID ARTERY - SUBCLAVIAN ARTERY BYPASS GRAFT  2004  . CESAREAN SECTION  1991  . EYE SURGERY     bi-lat lens transplant  . NASAL SINUS SURGERY  1998   Reconstruction   . TONSILLECTOMY  1992  . TUBAL LIGATION      There were no vitals filed for this visit.  Subjective Assessment - 10/06/17 1621    Subjective  Reports some soreness in hips.     Pertinent History  lumbar stenosis,Crohn's colitis, diabetes, obesity, HTN    Patient Stated Goals  Walk with no problems     Currently in Pain?  Yes    Pain Score  2     Pain Location  Hip    Pain Orientation  Left    Pain Descriptors / Indicators  Sore    Pain Type  Chronic pain    Pain Onset  More than a month ago    Pain Frequency  Intermittent    Aggravating Factors   standing long periods of time     Pain Relieving Factors  sitting, heat                       [facility] Adult PT Treatment/Exercise - 10/06/17 0001      Ambulation/Gait   Ambulation/Gait  Yes    Ambulation/Gait Assistance  6: Modified independent (Device/Increase time);5: Supervision    Ambulation/Gait Assistance Details  Gait for increased distance and endurance along with improved quality.  Due to increasing distance, had pt ambulate with rollator. Continue to note marked improvement in gait quality with rollator.  Pt prefers rollator over cane due to being able to sit on rollator when fatigued.  Pt able to ambulate for 4 mins without rest break and then another 2  mins.  Continue to encourage walking program (not just outdoors) to increase endurance.  Cues for upright posture, narrower BOS, and improved core activation.      Ambulation Distance (Feet)  -- >500    Assistive device  4-wheeled walker    Gait Pattern  Step-through pattern;Decreased step length - left;Decreased stance time - right;Decreased stride length;Decreased hip/knee flexion - right;Decreased dorsiflexion - right;Right hip hike;Left hip hike;Right steppage;Right foot flat;Antalgic;Wide base of support;Poor foot clearance - right    Ambulation Surface  Level;Indoor      Exercises   Exercises  Other Exercises    Other Exercises   Squats with UE support (chair behind for safety) x 10 reps, wall squats x 10 reps with cues for flat back and 45 deg knee bend, step up/down in // bars on aerobic step forwards/backwards x 10 reps each, side stepping up/down x 10 reps each with UE support,  Seated leg press x 10 reps with BLE at 50lbs, single LE x 10 reps each at 40lbs  (encouraged this machine at gym).  Heel raises/calf stretch with heels off aerobic step with UE support x 10 reps.  Note improvement in calf strength during session with decrease compensatory movement.               PT Education - 10/06/17 2013    Education provided  Yes    Education Details  plans for DC next week    Person(s) Educated  Patient;Child(ren)    Methods  Explanation;Demonstration    Comprehension  Verbalized understanding;Returned demonstration       PT Short Term Goals - 09/20/17 1912      PT SHORT TERM GOAL #1   Title  Patient demonstrates understanding of initial HEP. (All STGs Target Date: 09/16/2017)    Baseline  MET 09/20/2017    Time  5    Period  Weeks    Status  Achieved      PT SHORT TERM GOAL #2   Title  Time Up & Go <15sec    Baseline  MET 09/20/2017  TUG improved to 14.94sec with cane from 16.16sec    Time  5    Period  Weeks    Status  Achieved      PT SHORT TERM GOAL #3   Title  Berg Balance >/= 28/56    Baseline  MET 09/20/2017  Berg Balance improved to 33/56 from 20/56    Time  5    Period  Weeks    Status  Achieved        PT Long Term Goals - 09/20/17 1914      PT LONG TERM GOAL #1   Title  Patient demonstrates & verbalizes understanding of ongoing HEP / Fitness plan. (All LTGs Target Date: 10/21/2017)    Time  10    Period  Weeks    Status  On-going    Target Date  10/21/17      PT LONG TERM GOAL #2   Title  Berg Balance >38/56 to indicate lower fall risk.     Time  10    Period  Weeks    Status  Revised    Target Date  10/21/17      PT LONG TERM GOAL #3   Title  Timed Up & Go <13.5sec and Cognitive TUG <15.5sec to indicate lower fall risk.     Time  10    Period  Weeks    Status  On-going  Target Date  10/21/17      PT LONG TERM GOAL #4   Title  Dynamic Gait Index >/= 14/24 to indicate lower fall risk.     Time  10    Period  Weeks    Status  On-going    Target Date  10/21/17      PT LONG TERM GOAL #5   Title   Patient ambulates 500' outdoors with LRAD modified independent.     Time  10    Period  Weeks    Status  On-going    Target Date  10/21/17      PT LONG TERM GOAL #6   Title  Patient negotiates ramps, curbs & stairs with LRAD modified independent.     Time  10    Period  Weeks    Status  On-going    Target Date  10/21/17            Plan - 10/06/17 2014    Clinical Impression Statement  Skilled session began with discussion of D/C next week.  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  Pt in agreement.  Remainder of session focused on BLE strength and gait for quality/endurance.      Rehab Potential  Good    Clinical Impairments Affecting Rehab Potential  lumbar stenosis, compression fractures, Crohn's colitis, diabetes, obesity, HTN, macular degeneration    PT Frequency  2x / week    PT Duration  Other (comment) 10 weeks    PT Treatment/Interventions  ADLs/Self Care Home Management;DME Instruction;Gait training;Stair training;Functional mobility training;Therapeutic activities;Therapeutic exercise;Balance training;Neuromuscular re-education;Patient/family education;Orthotic Fit/Training;Vestibular;Passive range of motion;Manual techniques    PT Next Visit Plan  Robin-she is good with D/C following Wed appt-so work towards [place].  continue with core strength and BLE strengthening, endurance, balance.     Consulted and Agree with Plan of Care  Patient       Patient will benefit from skilled therapeutic intervention in order to improve the following deficits and impairments:  Abnormal gait, Decreased activity tolerance, Decreased balance, Decreased coordination, Decreased endurance, Decreased mobility, Decreased knowledge of use of DME, Impaired flexibility, Decreased strength, Postural dysfunction, Obesity  Visit Diagnosis: Other abnormalities of gait and mobility  Unsteadiness on  feet  Muscle weakness (generalized)     Problem List Patient Active Problem List   Diagnosis Date Noted  . Type 2 diabetes mellitus ([place]) 01/13/2017  . Subclavian arterial stenosis ([place]) 01/13/2017  . Iatrogenic Cushing's syndrome ([place]) 01/13/2017  . Essential hypertension 01/13/2017  . Crohn's disease of colon with rectal bleeding ([place]) 01/13/2017  . Regional enteritis of unspecified site 10/25/2011  . Family history of malignant neoplasm of gastrointestinal tract 09/17/2011   Cameron Sprang, PT, MPT [facility] [address] [place], [place], [address] Phone: [phone]   Fax:  [phone] 10/06/17, 8:17 PM  Name: Catherine Nicholson MRN: [id number] Date of Birth: May 21, 1961

## 2017-10-10 ENCOUNTER — Ambulatory Visit: Payer: BLUE CROSS/BLUE SHIELD | Admitting: Physical Therapy

## 2017-10-12 ENCOUNTER — Ambulatory Visit: Payer: BLUE CROSS/BLUE SHIELD | Admitting: Physical Therapy

## 2017-10-17 ENCOUNTER — Ambulatory Visit: Payer: BLUE CROSS/BLUE SHIELD | Admitting: Rehabilitation

## 2017-10-21 ENCOUNTER — Ambulatory Visit: Payer: BLUE CROSS/BLUE SHIELD | Admitting: Rehabilitation

## 2017-11-01 ENCOUNTER — Encounter: Payer: Self-pay | Admitting: Physical Therapy

## 2017-11-01 ENCOUNTER — Ambulatory Visit: Payer: BLUE CROSS/BLUE SHIELD | Attending: Orthopedic Surgery | Admitting: Physical Therapy

## 2017-11-01 DIAGNOSIS — R2681 Unsteadiness on feet: Secondary | ICD-10-CM | POA: Diagnosis present

## 2017-11-01 DIAGNOSIS — M6281 Muscle weakness (generalized): Secondary | ICD-10-CM | POA: Diagnosis present

## 2017-11-01 DIAGNOSIS — R2689 Other abnormalities of gait and mobility: Secondary | ICD-10-CM

## 2017-11-02 NOTE — Therapy (Signed)
[facility] [address] [place], [place], [address] Phone: [phone]   Fax:  [phone]  Physical Therapy Treatment  Patient Details  Name: Catherine Nicholson MRN: [id number] Date of Birth: 04/13/1961 Referring Provider: Elsie Saas, MD   Encounter Date: 11/01/2017  PT End of Session - 11/01/17 1935    Visit Number  11    Number of Visits  20    Date for PT Re-Evaluation  10/14/17    Authorization Type  BCBS deductable MET, 20% co-insurance, 30 visit OT/PT comb limit    Authorization - Visit Number  11    Authorization - Number of Visits  30    PT Start Time  2774    PT Stop Time  1612    PT Time Calculation (min)  39 min    Equipment Utilized During Treatment  Gait belt    Activity Tolerance  Patient tolerated treatment well    Behavior During Therapy  WFL for tasks assessed/performed       Past Medical History:  Diagnosis Date  . Allergy   . Anemia   . Anxiety   . Cataract   . Clotting disorder ([place])   . Crohn's colitis ([place]) 08-2011   Colonoscopy  . Depression   . GERD (gastroesophageal reflux disease)   . Glaucoma   . Hemorrhoids 08-2011   Colonoscopy   . Hyperlipidemia   . Hypertension   . IBS (irritable bowel syndrome)   . PID (pelvic inflammatory disease) 1989   with HSG  . Regional enteritis of unspecified site 10/25/2011    Past Surgical History:  Procedure Laterality Date  . BREAST BIOPSY  06/2002   Fibroadenoma  . CAROTID ARTERY - SUBCLAVIAN ARTERY BYPASS GRAFT  2004  . CESAREAN SECTION  1991  . EYE SURGERY     bi-lat lens transplant  . NASAL SINUS SURGERY  1998   Reconstruction   . TONSILLECTOMY  1992  . TUBAL LIGATION      There were no vitals filed for this visit.  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 She has been doing her exercises.     Pertinent History  lumbar stenosis,Crohn's  colitis, diabetes, obesity, HTN    Patient Stated Goals  Walk with no problems    Currently in Pain?  Yes    Pain Score  1     Pain Location  Hip    Pain Orientation  Left    Pain Descriptors / Indicators  Sore    Pain Type  Chronic pain    Pain Onset  More than a month ago    Pain Frequency  Intermittent    Aggravating Factors   standing long periods    Pain Relieving Factors  sitting, heat         [facility] PT Assessment - 11/01/17 1530      Ambulation/Gait   Ambulation/Gait  Yes    Ambulation/Gait Assistance  6: Modified independent (Device/Increase time)    Ambulation Distance (Feet)  520 Feet    Assistive device  None    Gait Pattern  Within Functional Limits;Step-through pattern;Wide base of support    Ambulation Surface  Indoor;Level;Outdoor;Paved;Gravel;Grass    Gait velocity  2.65 ft/sec comfortable & 3.08 ft/sec fast pace initial was 1.75 ft/sec comfortable    Stairs Assistance  6: Modified independent (Device/Increase time)  Stair Management Technique  Two rails;Alternating pattern;Forwards    Number of Stairs  4    Door Management  6: Modified independent (Device/Increase time)    Ramp  6: Modified independent (Device) no device    Curb  6: Modified independent (Device/increase time) no device      Berg Balance Test   Sit to Stand  Able to stand without using hands and stabilize independently    Standing Unsupported  Able to stand safely 2 minutes    Sitting with Back Unsupported but Feet Supported on Floor or Stool  Able to sit safely and securely 2 minutes    Stand to Sit  Sits safely with minimal use of hands    Transfers  Able to transfer safely, minor use of hands    Standing Unsupported with Eyes Closed  Able to stand 10 seconds safely    Standing Ubsupported with Feet Together  Able to place feet together independently and stand 1 minute safely    From Standing, Reach Forward with Outstretched Arm  Can reach confidently >25 cm (10")    From Standing  Position, Pick up Object from Floor  Able to pick up shoe safely and easily    From Standing Position, Turn to Look Behind Over each Shoulder  Looks behind from both sides and weight shifts well    Turn 360 Degrees  Able to turn 360 degrees safely but slowly    Standing Unsupported, Alternately Place Feet on Step/Stool  Able to stand independently and safely and complete 8 steps in 20 seconds    Standing Unsupported, One Foot in Front  Able to plae foot ahead of the other independently and hold 30 seconds    Standing on One Leg  Tries to lift leg/unable to hold 3 seconds but remains standing independently    Total Score  50    Berg comment:  Initial 20/56      Dynamic Gait Index   Level Surface  Mild Impairment    Change in Gait Speed  Mild Impairment    Gait with Horizontal Head Turns  Normal    Gait with Vertical Head Turns  Normal    Gait and Pivot Turn  Mild Impairment    Step Over Obstacle  Mild Impairment    Step Around Obstacles  Mild Impairment    Steps  Mild Impairment    Total Score  18    DGI comment:  initial DGI 6/24      Timed Up and Go Test   Normal TUG (seconds)  10 Initial TUG 14.94sec    Cognitive TUG (seconds)  13.46 Initial cog TUG 20.09sec                          PT Education - 11/01/17 1610    Education provided  Yes    Education Details  need & benefits to ongoing HEP / fitness plan,  recommended foot evaluation / inspection at all doctor appointments and daily    Person(s) Educated  Patient    Methods  Explanation;Verbal cues    Comprehension  Verbalized understanding       PT Short Term Goals - 09/20/17 1912      PT SHORT TERM GOAL #1   Title  Patient demonstrates understanding of initial HEP. (All STGs Target Date: 09/16/2017)    Baseline  MET 09/20/2017    Time  5    Period  Weeks  Status  Achieved      PT SHORT TERM GOAL #2   Title  Time Up & Go <15sec    Baseline  MET 09/20/2017  TUG improved to 14.94sec with cane from  16.16sec    Time  5    Period  Weeks    Status  Achieved      PT SHORT TERM GOAL #3   Title  Berg Balance >/= 28/56    Baseline  MET 09/20/2017  Berg Balance improved to 33/56 from 20/56    Time  5    Period  Weeks    Status  Achieved        PT Long Term Goals - 11/01/17 1939      PT LONG TERM GOAL #1   Title  Patient demonstrates & verbalizes understanding of ongoing HEP / Fitness plan. (All LTGs Target Date: 10/21/2017)    Baseline  MET 11/01/2017    Time  10    Period  Weeks    Status  Achieved      PT LONG TERM GOAL #2   Title  Berg Balance >38/56 to indicate lower fall risk.     Baseline  MET 11/01/2017  Berg improved from 20/56 to 50/56    Time  10    Period  Weeks    Status  Achieved      PT LONG TERM GOAL #3   Title  Timed Up & Go <13.5sec and Cognitive TUG <15.5sec to indicate lower fall risk.     Baseline  MET 11/01/2017  TUG std improved from 14.94sec to 10.00sec and cognitive TUG from 20.09 sec to 13.46sec    Time  10    Period  Weeks    Status  Achieved      PT LONG TERM GOAL #4   Title  Dynamic Gait Index >/= 14/24 to indicate lower fall risk.     Baseline  MET 11/01/2017  DGI improved from 6/24 to 18/24    Time  10    Period  Weeks    Status  Achieved      PT LONG TERM GOAL #5   Title  Patient ambulates 500' outdoors with LRAD modified independent.     Baseline  MET 11/01/2017    Time  10    Period  Weeks    Status  Achieved      PT LONG TERM GOAL #6   Title  Patient negotiates ramps, curbs & stairs with LRAD modified independent.     Baseline  MET 11/01/2017    Time  10    Period  Weeks    Status  Achieved            Plan - 11/01/17 1944    Clinical Impression Statement  Patient met all LTG and appears to be functioning at basic community level without device. PT recommended use of cane for medium distances or not good day & rollator for full community distances or walking program. She also appears to understand recommendation for ongoing HEP  / fitness plan.     Rehab Potential  Good    Clinical Impairments Affecting Rehab Potential  lumbar stenosis, compression fractures, Crohn's colitis, diabetes, obesity, HTN, macular degeneration    PT Frequency  2x / week    PT Duration  Other (comment) 10 weeks    PT Treatment/Interventions  ADLs/Self Care Home Management;DME Instruction;Gait training;Stair training;Functional mobility training;Therapeutic activities;Therapeutic exercise;Balance training;Neuromuscular re-education;Patient/family education;Orthotic Fit/Training;Vestibular;Passive range of motion;Manual techniques  PT Next Visit Plan  discharge    Consulted and Agree with Plan of Care  Patient       Patient will benefit from skilled therapeutic intervention in order to improve the following deficits and impairments:  Abnormal gait, Decreased activity tolerance, Decreased balance, Decreased coordination, Decreased endurance, Decreased mobility, Decreased knowledge of use of DME, Impaired flexibility, Decreased strength, Postural dysfunction, Obesity  Visit Diagnosis: Other abnormalities of gait and mobility  Unsteadiness on feet  Muscle weakness (generalized)     Problem List Patient Active Problem List   Diagnosis Date Noted  . Type 2 diabetes mellitus ([place]) 01/13/2017  . Subclavian arterial stenosis ([place]) 01/13/2017  . Iatrogenic Cushing's syndrome ([place]) 01/13/2017  . Essential hypertension 01/13/2017  . Crohn's disease of colon with rectal bleeding ([place]) 01/13/2017  . Regional enteritis of unspecified site 10/25/2011  . Family history of malignant neoplasm of gastrointestinal tract 09/17/2011    PHYSICAL THERAPY DISCHARGE SUMMARY  Visits from Start of Care: 11  Current functional level related to goals / functional outcomes: See above   Remaining deficits: See above   Education / Equipment: HEP & fitness plan  Plan: Patient agrees to discharge.  Patient goals were met. Patient is being discharged  due to meeting the stated rehab goals.  ?????         Kalimah Capurro PT, DPT 11/02/2017, 2:49 PM  [facility] [address] [place], [place], [address] Phone: [phone]   Fax:  [phone]  Name: Catherine Nicholson MRN: [id number] Date of Birth: 08/05/1961

## 2017-11-14 ENCOUNTER — Telehealth: Payer: Self-pay

## 2017-11-14 MED ORDER — PREDNISONE 1 MG PO TBEC: 5.0000 mg | DELAYED_RELEASE_TABLET | Freq: Every day | ORAL | 1 refills | Status: DC

## 2017-11-14 NOTE — Telephone Encounter (Signed)
Patient notified  Follow up arranged for 12/20/17

## 2017-11-14 NOTE — Telephone Encounter (Signed)
Patient requesting a refill of prednisone 1 mg tablets .  Please advise # and how many refills if any

## 2017-11-14 NOTE — Telephone Encounter (Signed)
Pl do for 150 tabs of 21m prednisone. 1 refill FU in 6-8 weeks

## 2017-11-15 MED ORDER — PREDNISONE 1 MG PO TABS: 5.0000 mg | ORAL_TABLET | Freq: Every day | ORAL | 1 refills | Status: DC

## 2017-11-15 NOTE — Addendum Note (Signed)
Addended by: Marlon Pel on: 11/15/2017 11:57 AM   Modules accepted: Orders

## 2017-12-16 ENCOUNTER — Encounter: Payer: Self-pay | Admitting: Gastroenterology

## 2017-12-20 ENCOUNTER — Ambulatory Visit: Payer: BLUE CROSS/BLUE SHIELD | Admitting: Gastroenterology

## 2018-01-06 ENCOUNTER — Encounter: Payer: Self-pay | Admitting: Gastroenterology

## 2018-01-06 ENCOUNTER — Ambulatory Visit (INDEPENDENT_AMBULATORY_CARE_PROVIDER_SITE_OTHER): Payer: BLUE CROSS/BLUE SHIELD | Admitting: Gastroenterology

## 2018-01-06 VITALS — BP 120/62 | HR 64 | Ht 66.0 in | Wt 176.0 lb

## 2018-01-06 DIAGNOSIS — K50919 Crohn's disease, unspecified, with unspecified complications: Secondary | ICD-10-CM

## 2018-01-06 MED ORDER — MESALAMINE ER 0.375 G PO CP24: 375.0000 mg | ORAL_CAPSULE | Freq: Four times a day (QID) | ORAL | 11 refills | Status: DC

## 2018-01-06 NOTE — Progress Notes (Signed)
Chief Complaint: FU  Referring Provider:  Street, Sharon Mt, *      ASSESSMENT AND PLAN;   #1. Crohn's colitis with pseudopolyposis- Dx 01/2012 by Dr Deatra Ina on colonoscopy with normal TI. Steroid dependent with cushingoid features, started Humira May 2014, dose increased to every week due to low trough levels, no antibody 05/2017 with good results, last Dexa scan 2018 Dr Venetia Maxon- neg, s/p up-to-date on vaccines (annual flu shot, shingles, pneumovax). Last colon 10/2016-multiple pseudopolyps transverse colon, pancolonic involvement with normal TI, left colonic diverticulosis. Also,  being followed by Dr. Renee Harder IBD clinic at [facility].  - Prednisone down to 57m po qd. Continue gradual taper - dropping 121m3 weeks. - Humira 4065mvery 10th day (?eye issues) to determine if eye problems get better. If not, then go back to every week. - Apriso 4/day to continue. - Dr. BloRenee Harders recommended annual colonoscopies due to multiple pseudopolyps and family history of colon cancer.  She wants to wait for colon until 10/2018 (was advised to get it done 10/2017). She understands that there is a small but definite risk of colorectal neoplasms and could have delay in diagnosis. She is willing to take the chance. - FU in 6 months, earlier in case of any problems.   #2.  Family history of cancer (father at the age of 68)46 HPI:    Catherine Nicholson a 57 35o. female  For follow-up visit Has been doing very well since Humira has been increased to every week based upon the trough levels.  Prior to increasing her level was 2.4 ug/ml with undetectable antibodies.  After increasing Humira, her trough level has increased to 5.8 ug/ml (still < 8). But, with significant relief.  Currently having 1 well-formed, bowel movement per day.  No blood in the stool.  No abdominal pain.  Feels best she has ever been.  The only issue is that of her vision.  She had some blurring -has been seen by ophthalmology and retina  specialist-  no definite etiology-she had it ever since Humira was increased to every week.  Of note that she did have cataract surgeries related to steroids  in the past. She wants to try to reduce the dose of Humira to every 10th day-this is to determine if her eye symptoms are any better.   No other neurologic problems.    Past Medical History:  Diagnosis Date  . Allergy   . Anemia   . Anxiety   . Cataract   . [place]   [place]  . Depression   . GERD (gastroesophageal reflux disease)   . Glaucoma   . Hemorrhoids 08-2011   Colonoscopy   . Hyperlipidemia   . Hypertension   . IBS (irritable bowel syndrome)   . PID (pelvic inflammatory disease) 1989   with HSG  . Regional enteritis of unspecified site 10/25/2011    Past Surgical History:  Procedure Laterality Date  . BREAST BIOPSY  06/2002   Fibroadenoma  . CAROTID ARTERY - SUBCLAVIAN ARTERY BYPASS GRAFT  2004  . CESAREAN SECTION  1991  . EYE SURGERY     bi-lat lens transplant  . NASAL SINUS SURGERY  1998   Reconstruction   . TONSILLECTOMY  1992  . TUBAL LIGATION      Family History  Problem Relation Age of Onset  . Heart disease Father   . Hypertension Father   . Heart attack Father 46 3  Colon cancer Mother   . Esophageal cancer Neg Hx   . Stomach cancer Neg Hx   . Rectal cancer Neg Hx     Social History   Tobacco Use  . Smoking status: Former Smoker    Years: 30.00    Types: Cigarettes    Last attempt to quit: 07/27/2010    Years since quitting: 7.4  . Smokeless tobacco: Never Used  Substance Use Topics  . Alcohol use: No  . Drug use: No    Current Outpatient Medications  Medication Sig Dispense Refill  . Adalimumab (HUMIRA PEN [place]) Inject into the skin. Pt takes one every 2 weeks    . amitriptyline (ELAVIL) 25 MG tablet Take 25 mg by mouth at bedtime.    Marland Kitchen aspirin 325 MG tablet Take 325 mg by mouth daily.    . mesalamine (APRISO) 0.375 G 24 hr  capsule Take 375 mg by mouth daily.    . Multiple Vitamins-Minerals (MULTIVITAMIN PO) Take by mouth.    . predniSONE (DELTASONE) 1 MG tablet Take 5 tablets (5 mg total) by mouth daily with breakfast. 150 tablet 1  . predniSONE (DELTASONE) 10 MG tablet Take 10 mg by mouth daily.    . rosuvastatin (CRESTOR) 40 MG tablet Take 40 mg by mouth daily.    Marland Kitchen esomeprazole (NEXIUM) 40 MG capsule Take 40 mg by mouth daily before breakfast.    . FeFum-FePoly-FA-B Cmp-C-Biot (INTEGRA PLUS PO) Take 125 mg by mouth daily.    Marland Kitchen FLUoxetine (PROZAC) 40 MG capsule 1 tablet Daily.    Marland Kitchen leuprolide (LUPRON DEPOT) 3.75 MG injection Inject 3.75 mg into the muscle every 28 (twenty-eight) days. (Patient not taking: Reported on 08/08/2017) 1 each 0  . lisinopril (PRINIVIL,ZESTRIL) 40 MG tablet Take 40 mg by mouth daily.    Marland Kitchen LUPRON DEPOT 3.75 MG injection Inject 3.75 mg into the muscle every 30 (thirty) days. (Patient not taking: Reported on 08/08/2017) 1 each 5  . mesalamine (LIALDA) 1.2 G EC tablet Take 4 tablets (4.8 g total) by mouth daily with breakfast. 120 tablet 5  . norethindrone (AYGESTIN) 5 MG tablet Take 1 tablet (5 mg total) by mouth daily. (Patient not taking: Reported on 08/08/2017) 30 tablet 5   No current facility-administered medications for this visit.     No Known Allergies  Review of Systems:  Constitutional: Denies fever, chills, diaphoresis, appetite change and fatigue.  HEENT: Denies photophobia, eye pain, redness, hearing loss, ear pain, congestion, sore throat, rhinorrhea, sneezing, mouth sores, neck pain, neck stiffness and tinnitus.   Respiratory: Denies SOB, DOE, cough, chest tightness,  and wheezing.   Cardiovascular: Denies chest pain, palpitations and leg swelling.  Genitourinary: Denies dysuria, urgency, frequency, hematuria, flank pain and difficulty urinating.  Musculoskeletal: Denies myalgias, back pain, joint swelling, arthralgias and gait problem.  Skin: No rash.  Neurological:  Denies dizziness, seizures, syncope, weakness, light-headedness, numbness and headaches.  Hematological: Denies adenopathy. Easy bruising, personal or family bleeding history  Psychiatric/Behavioral: No anxiety or depression     Physical Exam:    BP 120/62   Pulse 64   Ht 5' 6"  (1.676 m)   Wt 176 lb (79.8 kg)   BMI 28.41 kg/m  Filed Weights   01/06/18 1605  Weight: 176 lb (79.8 kg)   Constitutional:  Well-developed, in no acute distress. Psychiatric: Normal mood and affect. Behavior is normal. HEENT: Pupils normal.  Conjunctivae are normal. No scleral icterus. Moon facies Neck supple.  Cardiovascular: Normal rate, regular rhythm.  No edema Pulmonary/chest: Effort normal and breath sounds normal. No wheezing, rales or rhonchi. Abdominal: Soft, nondistended. Nontender. Bowel sounds active throughout. There are no masses palpable. No hepatomegaly. Rectal:  defered Neurological: Alert and oriented to person place and time. Skin: Skin is warm and dry. No rashes noted.    Carmell Austria, MD 01/06/2018, 4:20 PM  Cc: Street, Sharon Mt, *

## 2018-01-06 NOTE — Patient Instructions (Addendum)
If you are age 59 or older, your body mass index should be between 23-30. Your Body mass index is 28.41 kg/m. If this is out of the aforementioned range listed, please consider follow up with your Primary Care Provider.  If you are age 29 or younger, your body mass index should be between 19-25. Your Body mass index is 28.41 kg/m. If this is out of the aformentioned range listed, please consider follow up with your Primary Care Provider.   We have sent the following medications to your pharmacy for you to pick up at your convenience: Apriso 0.375g take four times a day.  Continue taking your Humira as prescribed.  Please call our office at [phone] to schedule a 6 month follow-up visit.  Thank you,  Dr. Jackquline Denmark

## 2018-01-09 ENCOUNTER — Telehealth: Payer: Self-pay

## 2018-01-09 NOTE — Telephone Encounter (Signed)
Colonoscopy recall set for 09/2018.

## 2018-05-06 ENCOUNTER — Other Ambulatory Visit: Payer: Self-pay | Admitting: Gastroenterology

## 2018-06-22 DIAGNOSIS — E1140 Type 2 diabetes mellitus with diabetic neuropathy, unspecified: Secondary | ICD-10-CM | POA: Diagnosis not present

## 2018-06-22 DIAGNOSIS — E7800 Pure hypercholesterolemia, unspecified: Secondary | ICD-10-CM | POA: Diagnosis not present

## 2018-06-22 DIAGNOSIS — E785 Hyperlipidemia, unspecified: Secondary | ICD-10-CM | POA: Diagnosis not present

## 2018-06-22 DIAGNOSIS — E1169 Type 2 diabetes mellitus with other specified complication: Secondary | ICD-10-CM | POA: Diagnosis not present

## 2018-06-29 DIAGNOSIS — R509 Fever, unspecified: Secondary | ICD-10-CM | POA: Diagnosis not present

## 2018-06-29 DIAGNOSIS — J0100 Acute maxillary sinusitis, unspecified: Secondary | ICD-10-CM | POA: Diagnosis not present

## 2018-07-13 DIAGNOSIS — L02811 Cutaneous abscess of head [any part, except face]: Secondary | ICD-10-CM | POA: Diagnosis not present

## 2018-07-22 ENCOUNTER — Other Ambulatory Visit: Payer: Self-pay | Admitting: Gastroenterology

## 2018-08-07 DIAGNOSIS — J0100 Acute maxillary sinusitis, unspecified: Secondary | ICD-10-CM | POA: Diagnosis not present

## 2018-09-11 ENCOUNTER — Other Ambulatory Visit: Payer: Self-pay

## 2018-09-11 ENCOUNTER — Telehealth: Payer: Self-pay | Admitting: Gastroenterology

## 2018-09-11 DIAGNOSIS — K50919 Crohn's disease, unspecified, with unspecified complications: Secondary | ICD-10-CM

## 2018-09-11 MED ORDER — ADALIMUMAB 40 MG/0.4ML ~~LOC~~ AJKT: 1.0000 "pen " | AUTO-INJECTOR | SUBCUTANEOUS | 3 refills | Status: DC

## 2018-09-11 NOTE — Progress Notes (Signed)
Humira refill placed to [facility]-patient also scheduled for a follow up appt with Dr. Lyndel Safe on 10/12/2018 arrival at 3:30 pm for a 3:45 pm appt

## 2018-09-11 NOTE — Telephone Encounter (Signed)
The patient has not been seen in the office since 12/2017 - does this patient need to come in for an office visit prior to the refill request being approved for Humira-please advise

## 2018-09-11 NOTE — Telephone Encounter (Signed)
[facility] call requested refill for Humira pen [address] [place] [address]

## 2018-09-11 NOTE — Telephone Encounter (Signed)
Lets refill Humira so that there are no breaks. Same dose, same frequency, for 1 month, 3 refills Follow-up in February or March

## 2018-10-08 IMAGING — XA Imaging study
1 series · 1 of 1 positions shown · non-contrast
Comparison: none

CLINICAL DATA: Lumbosacral spondylosis without myelopathy

[Series 1: ortho standard · 1 of 1 slices shown]
[im 1/1]
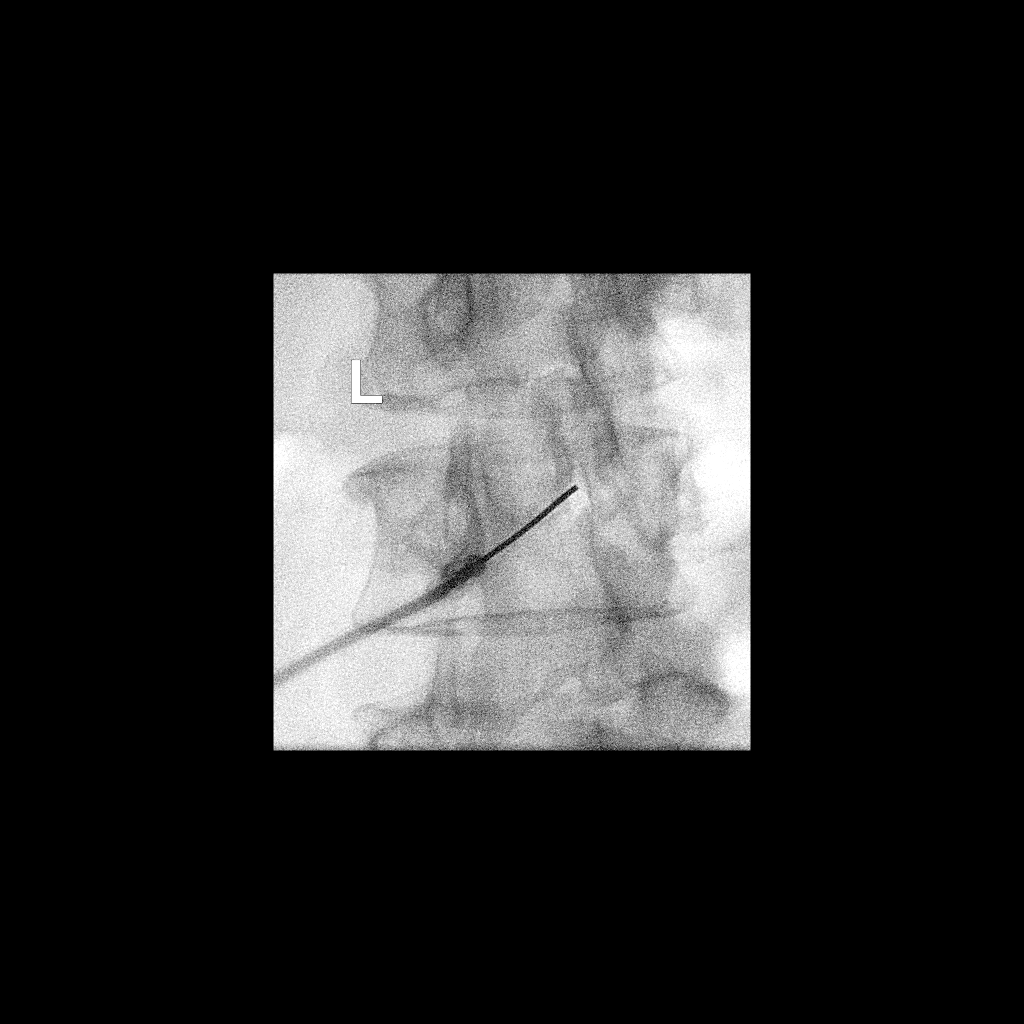

[1 of 1 positions shown; findings below may reference images not displayed]

FLUOROSCOPY TIME:  18 seconds

PROCEDURE:
The procedure, risks, benefits, and alternatives were explained to
the patient. Questions regarding the procedure were encouraged and
answered. The patient understands and consents to the procedure.

LUMBAR EPIDURAL INJECTION:

An interlaminar approach was performed on left at L3. The overlying
skin was cleansed and anesthetized. A 20 gauge epidural needle was
advanced using loss-of-resistance technique.

DIAGNOSTIC EPIDURAL INJECTION:

Injection of Isovue-M 200 shows a good epidural pattern with spread
above and below the level of needle placement, primarily on the left
no vascular opacification is seen.

THERAPEUTIC EPIDURAL INJECTION:

One hundred twenty Mg of Depo-Medrol mixed with 3 cc 1% lidocaine
were instilled. The procedure was well-tolerated, and the patient
was discharged thirty minutes following the injection in good
condition.

COMPLICATIONS:
None
IMPRESSION: Technically successful epidural injection on the left L3-4.

## 2018-10-12 ENCOUNTER — Ambulatory Visit: Payer: BLUE CROSS/BLUE SHIELD | Admitting: Gastroenterology

## 2018-10-18 DIAGNOSIS — E1169 Type 2 diabetes mellitus with other specified complication: Secondary | ICD-10-CM | POA: Diagnosis not present

## 2018-10-18 DIAGNOSIS — E785 Hyperlipidemia, unspecified: Secondary | ICD-10-CM | POA: Diagnosis not present

## 2018-10-18 DIAGNOSIS — J309 Allergic rhinitis, unspecified: Secondary | ICD-10-CM | POA: Diagnosis not present

## 2018-10-18 DIAGNOSIS — J019 Acute sinusitis, unspecified: Secondary | ICD-10-CM | POA: Diagnosis not present

## 2018-10-18 DIAGNOSIS — E1140 Type 2 diabetes mellitus with diabetic neuropathy, unspecified: Secondary | ICD-10-CM | POA: Diagnosis not present

## 2018-10-22 IMAGING — XA Imaging study
3 series · 3 of 3 positions shown · non-contrast
Comparison: none

CLINICAL DATA: Lumbosacral spondylosis without myelopathy. Overall
good response to the 2 prior epidural injections with sustained
improvement of low back pain. Persistent bothersome lower extremity
numbness and weakness, currently right greater than left, affecting
the ability to walk.

[Series 1: ortho standard · 1 of 1 slices shown (1 of 3)]
[im 1/1]
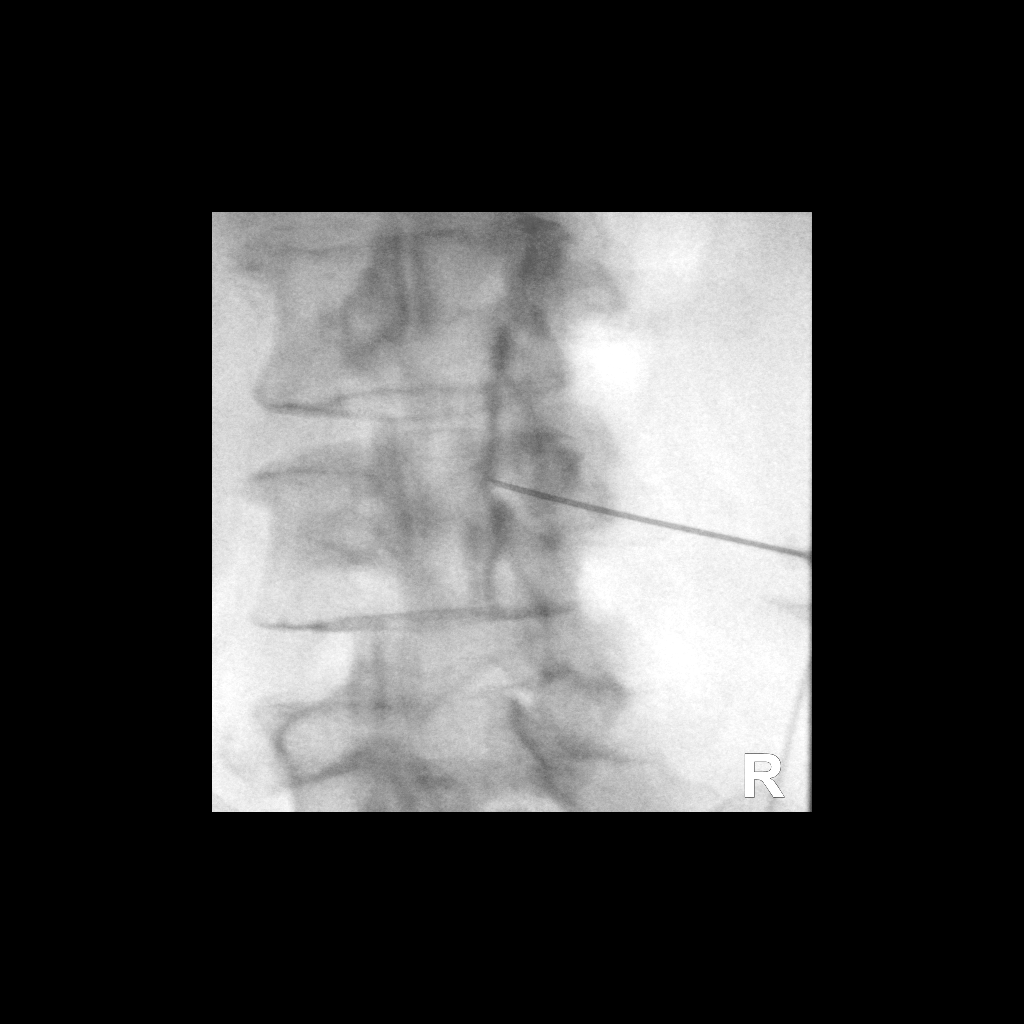

[Series 2: ortho standard · 1 of 1 slices shown (2 of 3)]
[im 1/1]
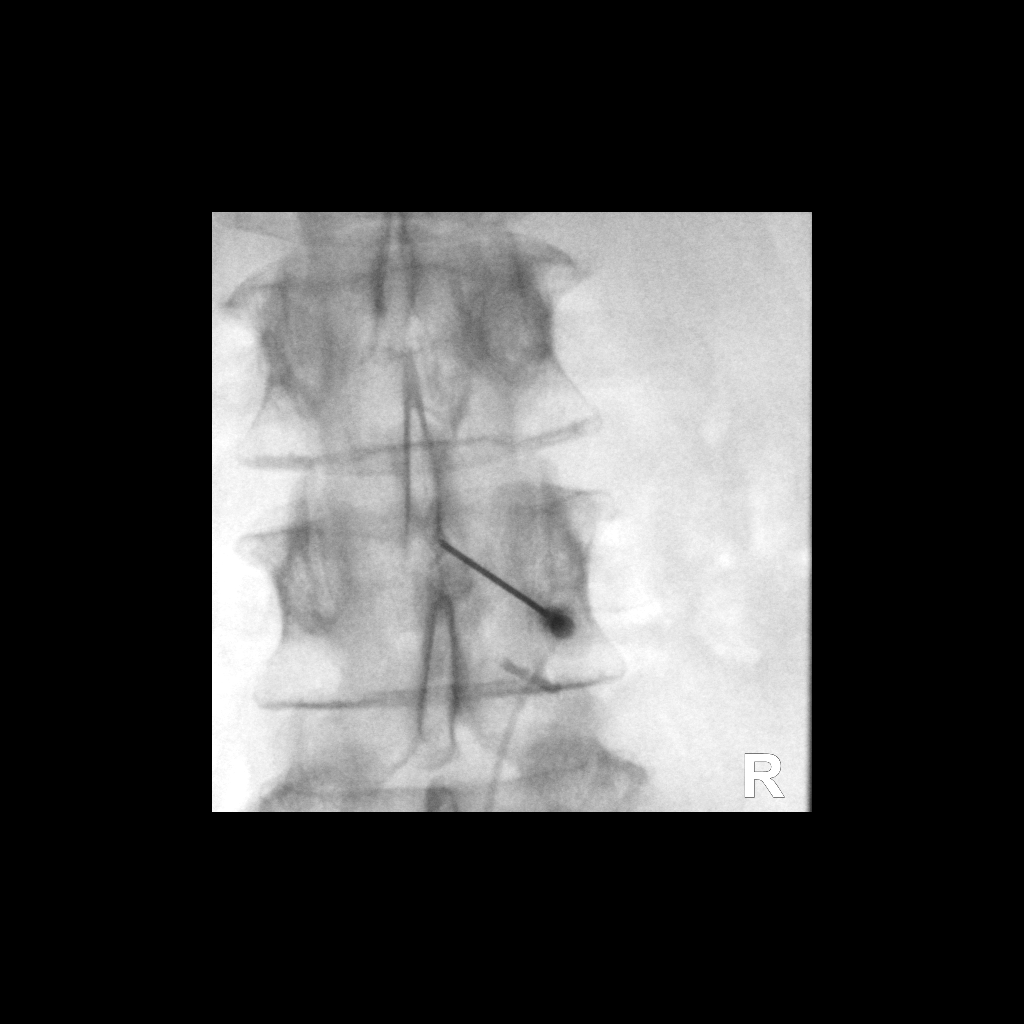

[Series 3: ortho standard · 1 of 1 slices shown (3 of 3)]
[im 1/1]
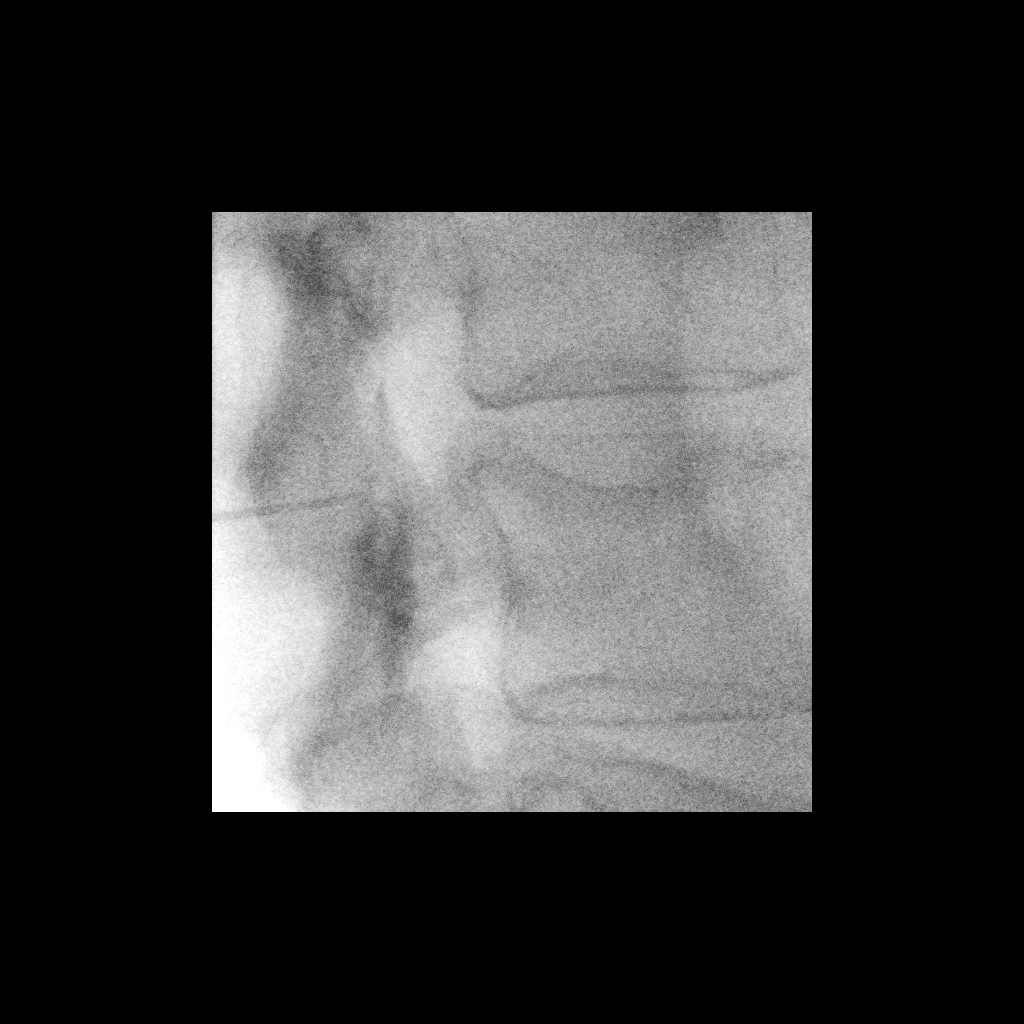

[3 of 3 positions shown; findings below may reference images not displayed]

FLUOROSCOPY TIME:  Radiation Exposure Index (as provided by the
fluoroscopic device): 31.77 microGray*m^2

Fluoroscopy Time (in minutes and seconds):  15 seconds

PROCEDURE:
The procedure, risks, benefits, and alternatives were explained to
the patient. Questions regarding the procedure were encouraged and
answered. The patient understands and consents to the procedure.

LUMBAR EPIDURAL INJECTION:

An interlaminar approach was performed on the right at L3-4. The
overlying skin was cleansed and anesthetized. A 3.5 in 20 gauge
epidural needle was advanced using loss-of-resistance technique.

DIAGNOSTIC EPIDURAL INJECTION:

Injection of Isovue-M 200 shows a good epidural pattern with spread
above and below the level of needle placement, primarily on the
right. No vascular opacification is seen.

THERAPEUTIC EPIDURAL INJECTION:

120 mg of Depo-Medrol mixed with 3 mL of 1% lidocaine were
instilled. The procedure was well-tolerated, and the patient was
discharged thirty minutes following the injection in good condition.

COMPLICATIONS:
None
IMPRESSION: Technically successful lumbar interlaminar epidural injection on the
right at L3-4.

## 2018-10-24 ENCOUNTER — Encounter: Payer: Self-pay | Admitting: Gastroenterology

## 2018-10-24 ENCOUNTER — Ambulatory Visit (INDEPENDENT_AMBULATORY_CARE_PROVIDER_SITE_OTHER): Payer: BLUE CROSS/BLUE SHIELD | Admitting: Gastroenterology

## 2018-10-24 VITALS — BP 144/88 | HR 106 | Ht 66.0 in | Wt 179.0 lb

## 2018-10-24 DIAGNOSIS — H40053 Ocular hypertension, bilateral: Secondary | ICD-10-CM | POA: Diagnosis not present

## 2018-10-24 DIAGNOSIS — K50919 Crohn's disease, unspecified, with unspecified complications: Secondary | ICD-10-CM

## 2018-10-24 DIAGNOSIS — Z961 Presence of intraocular lens: Secondary | ICD-10-CM | POA: Diagnosis not present

## 2018-10-24 DIAGNOSIS — H524 Presbyopia: Secondary | ICD-10-CM | POA: Diagnosis not present

## 2018-10-24 DIAGNOSIS — H26492 Other secondary cataract, left eye: Secondary | ICD-10-CM | POA: Diagnosis not present

## 2018-10-24 MED ORDER — PREDNISONE 1 MG PO TABS: ORAL_TABLET | ORAL | 0 refills | Status: DC

## 2018-10-24 NOTE — Patient Instructions (Signed)
If you are age 60 or older, your body mass index should be between 23-30. Your Body mass index is 28.89 kg/m. If this is out of the aforementioned range listed, please consider follow up with your Primary Care Provider.  If you are age 73 or younger, your body mass index should be between 19-25. Your Body mass index is 28.89 kg/m. If this is out of the aformentioned range listed, please consider follow up with your Primary Care Provider.   We have sent the following medications to your pharmacy for you to pick up at your convenience: Prednisone  Thank you,  Dr. Jackquline Denmark

## 2018-10-24 NOTE — Progress Notes (Signed)
Chief Complaint: FU  Referring Provider:  Street, Sharon Mt, *      ASSESSMENT AND PLAN;   #1. Crohn's colitis with pseudopolyposis- Dx 01/2012 by Dr Deatra Ina on colon with Nl TI. Steroid dependent with cushingoid features, started Humira May 2014, dose increased to every week due to low trough levels, neg Ab 05/2017 with good results, last Dexa scan 2018 Dr Venetia Maxon- neg, s/p up-to-date on vaccines (annual flu shot, shingles, pneumovax). Last colon 10/2016-multiple pseudopolyps transverse colon, pancolonic involvement with normal TI, left colonic diverticulosis. Also,  being followed by Dr. Renee Harder IBD clinic at [facility].  Plan - Prednisone down to 51m po qd. Continue gradual taper - dropping 138m3 weeks. - Continue Humira 4041mvery week. - Apriso 4/day to continue. - Dr. BloRenee Harders recommended annual colonoscopies due to multiple pseudopolyps and family history of colon cancer.  She wants to wait for colon until 01/2019 (was advised to get it done 10/2017). She understands that there is a small but definite risk of colorectal neoplasms and could have delay in diagnosis. She is willing to take the chance. - FU in 6 months, earlier in case of any problems. - Blood work from Dr StrVenetia Maxon  #2.  Family history of cancer (father at the age of 68)47 HPI:    [name] a 57 44o. female  For follow-up visit Has been doing very well since Humira has been increased to every week based upon the trough levels.  Prior to increasing her level was 2.4 ug/ml with undetectable antibodies.  After increasing Humira, her trough level has increased to 5.8 ug/ml (still < 8). But, with significant relief.  No GI complaints.  Doing very well.  Has agreed to get colonoscopy performed in June this year.  Unfortunately continues to be on prednisone but has dropped dose down to 10 mg p.o. once a day.  Wants to continue on Apriso as well.  No melena or hematochezia.  No weight loss.    Past  Medical History:  Diagnosis Date  . Allergy   . Anemia   . Anxiety   . Cataract   . [place]   [place]  . Depression   . GERD (gastroesophageal reflux disease)   . Glaucoma   . Hemorrhoids 08-2011   Colonoscopy   . Hyperlipidemia   . Hypertension   . IBS (irritable bowel syndrome)   . PID (pelvic inflammatory disease) 1989   with HSG  . Regional enteritis of unspecified site 10/25/2011    Past Surgical History:  Procedure Laterality Date  . BREAST BIOPSY  06/2002   Fibroadenoma  . CAROTID ARTERY - SUBCLAVIAN ARTERY BYPASS GRAFT  2004  . CESAREAN SECTION  1991  . EYE SURGERY     bi-lat lens transplant  . NASAL SINUS SURGERY  1998   Reconstruction   . TONSILLECTOMY  1992  . TUBAL LIGATION      Family History  Problem Relation Age of Onset  . Heart disease Father   . Hypertension Father   . Heart attack Father 46 62 Colon cancer Mother   . Esophageal cancer Neg Hx   . Stomach cancer Neg Hx   . Rectal cancer Neg Hx     Social History   Tobacco Use  . Smoking status: Former Smoker    Years: 30.00    Types: Cigarettes    Last attempt to quit: 07/27/2010  Years since quitting: 8.2  . Smokeless tobacco: Never Used  Substance Use Topics  . Alcohol use: No  . Drug use: No    Current Outpatient Medications  Medication Sig Dispense Refill  . Adalimumab (HUMIRA PEN) 40 MG/0.4ML PNKT Inject 1 pen into the skin once a week. Pt takes one every week 4 each 3  . amitriptyline (ELAVIL) 25 MG tablet Take 25 mg by mouth at bedtime.    Marland Kitchen aspirin 325 MG tablet Take 325 mg by mouth daily.    Marland Kitchen lisinopril (PRINIVIL,ZESTRIL) 40 MG tablet Take 40 mg by mouth daily.    . mesalamine (APRISO) 0.375 G 24 hr capsule Take 375 mg by mouth daily.    . mesalamine (APRISO) 0.375 g 24 hr capsule Take 1 capsule (0.375 g total) by mouth 4 (four) times daily. 120 capsule 11  . Multiple Vitamins-Minerals (MULTIVITAMIN PO) Take by mouth.      . predniSONE (DELTASONE) 1 MG tablet TAKE 5 TABLETS (5 MG TOTAL) BY MOUTH DAILY WITH BREAKFAST. 150 tablet 1  . predniSONE (DELTASONE) 10 MG tablet Take 10 mg by mouth daily.    . rosuvastatin (CRESTOR) 40 MG tablet Take 40 mg by mouth daily.     No current facility-administered medications for this visit.     No Known Allergies  Review of Systems:  neg    Physical Exam:    BP (!) 144/88   Pulse (!) 106   Ht 5' 6"  (1.676 m)   Wt 179 lb (81.2 kg)   SpO2 95%   BMI 28.89 kg/m  Filed Weights   10/24/18 1527  Weight: 179 lb (81.2 kg)   Constitutional:  Well-developed, in no acute distress. Psychiatric: Normal mood and affect. Behavior is normal. HEENT: Pupils normal.  Conjunctivae are normal. No scleral icterus. Moon facies Neck supple.  Cardiovascular: Normal rate, regular rhythm. No edema Pulmonary/chest: Effort normal and breath sounds normal. No wheezing, rales or rhonchi. Abdominal: Soft, nondistended. Nontender. Bowel sounds active throughout. There are no masses palpable. No hepatomegaly. Rectal:  defered Neurological: Alert and oriented to person place and time. Skin: Skin is warm and dry. No rashes noted.    Carmell Austria, MD 10/24/2018, 3:43 PM  Cc: Street, Sharon Mt, *

## 2018-11-30 DIAGNOSIS — J019 Acute sinusitis, unspecified: Secondary | ICD-10-CM | POA: Diagnosis not present

## 2018-11-30 DIAGNOSIS — Z6828 Body mass index (BMI) 28.0-28.9, adult: Secondary | ICD-10-CM | POA: Diagnosis not present

## 2018-11-30 DIAGNOSIS — J309 Allergic rhinitis, unspecified: Secondary | ICD-10-CM | POA: Diagnosis not present

## 2018-12-27 ENCOUNTER — Other Ambulatory Visit: Payer: Self-pay | Admitting: Gastroenterology

## 2018-12-27 DIAGNOSIS — K50919 Crohn's disease, unspecified, with unspecified complications: Secondary | ICD-10-CM

## 2019-01-05 ENCOUNTER — Encounter: Payer: Self-pay | Admitting: Gastroenterology

## 2019-01-09 ENCOUNTER — Encounter: Payer: Self-pay | Admitting: Gastroenterology

## 2019-01-09 ENCOUNTER — Ambulatory Visit: Payer: BLUE CROSS/BLUE SHIELD

## 2019-01-09 ENCOUNTER — Other Ambulatory Visit: Payer: Self-pay

## 2019-01-09 VITALS — Ht 66.0 in | Wt 170.0 lb

## 2019-01-09 DIAGNOSIS — K50919 Crohn's disease, unspecified, with unspecified complications: Secondary | ICD-10-CM

## 2019-01-09 DIAGNOSIS — Z800 Family history of malignant neoplasm of digestive organs: Secondary | ICD-10-CM

## 2019-01-09 MED ORDER — NA SULFATE-K SULFATE-MG SULF 17.5-3.13-1.6 GM/177ML PO SOLN
1.0000 | Freq: Once | ORAL | 0 refills | Status: AC
Start: 2019-01-09 — End: 2019-01-09

## 2019-01-09 NOTE — Progress Notes (Signed)
No egg or soy allergy known to patient  No issues with past sedation with any surgeries  or procedures, no intubation problems  No diet pills per patient No home 02 use per patient  No blood thinners per patient  Pt denies issues with constipation  No A fib or A flutter  EMMI video sent to pt's e mail , pt declined    

## 2019-01-19 ENCOUNTER — Telehealth: Payer: Self-pay | Admitting: *Deleted

## 2019-01-19 NOTE — Telephone Encounter (Signed)
Covid-19 screening questions  Have you traveled in the last 14 days? no If yes where?  Do you now or have you had a fever in the last 14 days? no  Do you have any respiratory symptoms of shortness of breath or cough now or in the last 14 days? no  Do you have any family members or close contacts with diagnosed or suspected Covid-19 in the past 14 days? no  Have you been tested for Covid-19 and found to be positive? no  Pt told that care partner would wait in the car but we request that they stay on the premises.

## 2019-01-22 ENCOUNTER — Other Ambulatory Visit: Payer: Self-pay

## 2019-01-22 ENCOUNTER — Ambulatory Visit (AMBULATORY_SURGERY_CENTER): Payer: BLUE CROSS/BLUE SHIELD | Admitting: Gastroenterology

## 2019-01-22 ENCOUNTER — Encounter: Payer: Self-pay | Admitting: Gastroenterology

## 2019-01-22 VITALS — BP 116/65 | HR 86 | Temp 98.6°F | Resp 15 | Ht 66.0 in | Wt 179.0 lb

## 2019-01-22 DIAGNOSIS — Z800 Family history of malignant neoplasm of digestive organs: Secondary | ICD-10-CM

## 2019-01-22 DIAGNOSIS — Z86010 Personal history of colonic polyps: Secondary | ICD-10-CM

## 2019-01-22 DIAGNOSIS — D123 Benign neoplasm of transverse colon: Secondary | ICD-10-CM

## 2019-01-22 DIAGNOSIS — K5140 Inflammatory polyps of colon without complications: Secondary | ICD-10-CM | POA: Diagnosis not present

## 2019-01-22 DIAGNOSIS — K51419 Inflammatory polyps of colon with unspecified complications: Secondary | ICD-10-CM

## 2019-01-22 DIAGNOSIS — K50919 Crohn's disease, unspecified, with unspecified complications: Secondary | ICD-10-CM | POA: Diagnosis not present

## 2019-01-22 MED ORDER — SODIUM CHLORIDE 0.9 % IV SOLN: 500.0000 mL | Freq: Once | INTRAVENOUS | Status: DC

## 2019-01-22 NOTE — Progress Notes (Signed)
Pt's states no medical or surgical changes since previsit or office visit.  Temp taken by Emmit Alexanders, CMA V/S taken by Judson Roch Monday, RN

## 2019-01-22 NOTE — Progress Notes (Signed)
Called to room to assist during endoscopic procedure.  Patient ID and intended procedure confirmed with present staff. Received instructions for my participation in the procedure from the performing physician.  

## 2019-01-22 NOTE — Progress Notes (Signed)
PT taken to PACU. Monitors in place. VSS. Report given to RN. 

## 2019-01-22 NOTE — Op Note (Signed)
[place] Patient Name: Catherine Nicholson Procedure Date: 01/22/2019 8:15 AM MRN: [id number] Endoscopist: Jackquline Denmark , MD Age: 58 Referring MD:  Date of Birth: 1961/06/22 Gender: Female Account #: [id number] Procedure:                Colonoscopy Indications:              History of Crohn's disease diagnosed 2013 with                            multiple pseudopolyps. Family history of colon                            cancer in a first-degree relative before age 55                            years. Medicines:                Monitored Anesthesia Care Procedure:                Pre-Anesthesia Assessment:                           - Prior to the procedure, a History and Physical                            was performed, and patient medications and                            allergies were reviewed. The patient's tolerance of                            previous anesthesia was also reviewed. The risks                            and benefits of the procedure and the sedation                            options and risks were discussed with the patient.                            All questions were answered, and informed consent                            was obtained. Prior Anticoagulants: The patient has                            taken no previous anticoagulant or antiplatelet                            agents. ASA Grade Assessment: III - A patient with                            severe systemic disease. After reviewing the risks  and benefits, the patient was deemed in                            satisfactory condition to undergo the procedure.                           After obtaining informed consent, the colonoscope                            was passed under direct vision. Throughout the                            procedure, the patient's blood pressure, pulse, and                            oxygen saturations were monitored continuously. The                             Colonoscope was introduced through the anus and                            advanced to the 2 cm into the ileum. The                            colonoscopy was performed without difficulty. The                            patient tolerated the procedure well. The quality                            of the bowel preparation was fair. The terminal                            ileum, ileocecal valve, appendiceal orifice, and                            rectum were photographed. Scope In: 8:20:27 AM Scope Out: 8:45:07 AM Scope Withdrawal Time: 0 hours 16 minutes 56 seconds  Total Procedure Duration: 0 hours 24 minutes 40 seconds  Findings:                 There was some scarring in the ascending colon                            without active ulcers or erosions. The mucosa in                            the descending colon, sigmoid colon and rectum was                            normal. Multiple biopsies were obtained from                            throughout the colon and sent for histology to  r/o                            dysplasia. Terminal ileum was normal.                           Several (60-70) polyps (likely pseudopolyps, some                            with superficial ulcerations), ranging 6-15 mm in                            size were noted in the transverse colon. These were                            both sessile and pedunculated. 3 of the larger                            polyps were removed using a snare cautery                            polypectomy. These polyps were removed with a hot                            snare. Resection and retrieval were complete.                            Estimated blood loss: none.                           Multiple medium-mouthed diverticula were found in                            the sigmoid colon and descending colon. Complications:            No immediate complications. Estimated Blood Loss:     Estimated blood loss:  none. Impression:               -Preparation of the colon was fair.                           -Crohn's disease with scarring in the ascending                            colon.                           -Multiple polyps in the transverse colon (s/p post                            polypectomy x3, likely pseudopolyps)                           -Left colonic diverticulosis.                           -Family history of  colon cancer. Recommendation:           - Patient has a contact number available for                            emergencies. The signs and symptoms of potential                            delayed complications were discussed with the                            patient. Return to normal activities tomorrow.                            Written discharge instructions were provided to the                            patient.                           - Resume previous diet.                           - Continue present medications.                           - Await pathology results.                           - Repeat colonoscopy in 1 year for surveillance                            after 2-day prep. Due to multiplicity of colonic                            polyps, strong family history of colon cancer-it is                            difficult to a certain which polyps could turn out                            to be adenomatous. Patient does understand that she                            is at a very high risk for colon cancer despite                            surveillance.                           - Return to GI clinic PRN. Jackquline Denmark, MD 01/22/2019 9:00:02 AM This report has been signed electronically.

## 2019-01-22 NOTE — Patient Instructions (Signed)
YOU HAD AN ENDOSCOPIC PROCEDURE TODAY AT [facility]:   Refer to the procedure report that was given to you for any specific questions about what was found during the examination.  If the procedure report does not answer your questions, please call your gastroenterologist to clarify.  If you requested that your care partner not be given the details of your procedure findings, then the procedure report has been included in a sealed envelope for you to review at your convenience later.  YOU SHOULD EXPECT: Some feelings of bloating in the abdomen. Passage of more gas than usual.  Walking can help get rid of the air that was put into your GI tract during the procedure and reduce the bloating. If you had a lower endoscopy (such as a colonoscopy or flexible sigmoidoscopy) you may notice spotting of blood in your stool or on the toilet paper. If you underwent a bowel prep for your procedure, you may not have a normal bowel movement for a few days.  Please Note:  You might notice some irritation and congestion in your nose or some drainage.  This is from the oxygen used during your procedure.  There is no need for concern and it should clear up in a day or so.  SYMPTOMS TO REPORT IMMEDIATELY:   Following lower endoscopy (colonoscopy or flexible sigmoidoscopy):  Excessive amounts of blood in the stool  Significant tenderness or worsening of abdominal pains  Swelling of the abdomen that is new, acute  Fever of 100F or higher  For urgent or emergent issues, a gastroenterologist can be reached at any hour by calling [phone].   DIET:  We do recommend a small meal at first, but then you may proceed to your regular diet.  Try to eat a high fiber diet, and drink a lot of water.  Drink plenty of fluids but you should avoid alcoholic beverages for 24 hours.  ACTIVITY:  You should plan to take it easy for the rest of today and you should NOT DRIVE or use heavy machinery until tomorrow  (because of the sedation medicines used during the test).    FOLLOW UP: Our staff will call the number listed on your records 48-72 hours following your procedure to check on you and address any questions or concerns that you may have regarding the information given to you following your procedure. If we do not reach you, we will leave a message.  We will attempt to reach you two times.  During this call, we will ask if you have developed any symptoms of COVID 19. If you develop any symptoms (ie: fever, flu-like symptoms, shortness of breath, cough etc.) before then, please call [phone].  If you test positive for Covid 19 in the 2 weeks post procedure, please call and report this information to [place].    If any biopsies were taken you will be contacted by phone or by letter within the next 1-3 weeks.  Please call us at [phone] if you have not heard about the biopsies in 3 weeks.    SIGNATURES/CONFIDENTIALITY: You and/or your care partner have signed paperwork which will be entered into your electronic medical record.  These signatures attest to the fact that that the information above on your After Visit Summary has been reviewed and is understood.  Full responsibility of the confidentiality of this discharge information lies with you and/or your care-partner.

## 2019-01-24 ENCOUNTER — Telehealth: Payer: Self-pay

## 2019-01-24 NOTE — Telephone Encounter (Signed)
  Follow up Call-  Call back number 01/22/2019  Post procedure Call Back phone  # [phone]  Permission to leave phone message Yes  Some recent data might be hidden     Patient questions:  Do you have a fever, pain , or abdominal swelling? No. Pain Score  0 *  Have you tolerated food without any problems? Yes.    Have you been able to return to your normal activities? Yes.    Do you have any questions about your discharge instructions: Diet   No. Medications  No. Follow up visit  No.  Do you have questions or concerns about your Care? No.  Actions: * If pain score is 4 or above: No action needed, pain <4.  1. Have you developed a fever since your procedure? no  2.   Have you had an respiratory symptoms (SOB or cough) since your procedure? no  3.   Have you tested positive for COVID 19 since your procedure no  4.   Have you had any family members/close contacts diagnosed with the COVID 19 since your procedure?  no   If yes to any of these questions please route to Joylene John, RN and Alphonsa Gin, Therapist, sports.

## 2019-01-25 ENCOUNTER — Encounter: Payer: Self-pay | Admitting: Gastroenterology

## 2019-01-29 ENCOUNTER — Other Ambulatory Visit: Payer: Self-pay | Admitting: Gastroenterology

## 2019-04-10 ENCOUNTER — Other Ambulatory Visit: Payer: Self-pay | Admitting: Gastroenterology

## 2019-04-10 DIAGNOSIS — K50919 Crohn's disease, unspecified, with unspecified complications: Secondary | ICD-10-CM

## 2019-04-18 ENCOUNTER — Telehealth: Payer: Self-pay | Admitting: Gastroenterology

## 2019-04-18 NOTE — Telephone Encounter (Signed)
Please advise 

## 2019-04-18 NOTE — Telephone Encounter (Signed)
Pt reported that savings card online have expired for Aprisio--pt requested a cheaper alt.

## 2019-04-18 NOTE — Telephone Encounter (Signed)
Can you please get her a new Apriso card Thx RG

## 2019-04-19 NOTE — Telephone Encounter (Signed)
There is not a savings card available now. Apriso has went generic and according to the patient with her insurance it is still $300.

## 2019-04-25 NOTE — Telephone Encounter (Signed)
Can try generic mesalamine PO 500 mg 3 times daily, #90, 11 refills RG

## 2019-04-26 MED ORDER — MESALAMINE ER 500 MG PO CPCR: 500.0000 mg | ORAL_CAPSULE | Freq: Three times a day (TID) | ORAL | 11 refills | Status: DC

## 2019-04-26 NOTE — Telephone Encounter (Signed)
Sent prescription to patients pharmacy.

## 2019-07-23 DIAGNOSIS — E1140 Type 2 diabetes mellitus with diabetic neuropathy, unspecified: Secondary | ICD-10-CM | POA: Diagnosis not present

## 2019-07-23 DIAGNOSIS — E1169 Type 2 diabetes mellitus with other specified complication: Secondary | ICD-10-CM | POA: Diagnosis not present

## 2019-07-23 DIAGNOSIS — E7800 Pure hypercholesterolemia, unspecified: Secondary | ICD-10-CM | POA: Diagnosis not present

## 2019-07-23 DIAGNOSIS — Z23 Encounter for immunization: Secondary | ICD-10-CM | POA: Diagnosis not present

## 2019-07-23 DIAGNOSIS — E785 Hyperlipidemia, unspecified: Secondary | ICD-10-CM | POA: Diagnosis not present

## 2019-07-23 DIAGNOSIS — J019 Acute sinusitis, unspecified: Secondary | ICD-10-CM | POA: Diagnosis not present

## 2019-08-29 ENCOUNTER — Other Ambulatory Visit: Payer: Self-pay | Admitting: Gastroenterology

## 2019-08-29 DIAGNOSIS — K50919 Crohn's disease, unspecified, with unspecified complications: Secondary | ICD-10-CM

## 2019-12-06 ENCOUNTER — Other Ambulatory Visit: Payer: Self-pay | Admitting: Gastroenterology

## 2019-12-06 DIAGNOSIS — K50919 Crohn's disease, unspecified, with unspecified complications: Secondary | ICD-10-CM

## 2020-01-14 ENCOUNTER — Other Ambulatory Visit: Payer: Self-pay | Admitting: Gastroenterology

## 2020-01-14 DIAGNOSIS — K50919 Crohn's disease, unspecified, with unspecified complications: Secondary | ICD-10-CM

## 2020-01-29 DIAGNOSIS — H40053 Ocular hypertension, bilateral: Secondary | ICD-10-CM | POA: Diagnosis not present

## 2020-01-29 DIAGNOSIS — H524 Presbyopia: Secondary | ICD-10-CM | POA: Diagnosis not present

## 2020-02-29 DIAGNOSIS — E1140 Type 2 diabetes mellitus with diabetic neuropathy, unspecified: Secondary | ICD-10-CM | POA: Diagnosis not present

## 2020-02-29 DIAGNOSIS — E7800 Pure hypercholesterolemia, unspecified: Secondary | ICD-10-CM | POA: Diagnosis not present

## 2020-02-29 DIAGNOSIS — E1169 Type 2 diabetes mellitus with other specified complication: Secondary | ICD-10-CM | POA: Diagnosis not present

## 2020-02-29 DIAGNOSIS — E785 Hyperlipidemia, unspecified: Secondary | ICD-10-CM | POA: Diagnosis not present

## 2020-04-14 ENCOUNTER — Other Ambulatory Visit: Payer: Self-pay | Admitting: Gastroenterology

## 2020-04-14 DIAGNOSIS — K50919 Crohn's disease, unspecified, with unspecified complications: Secondary | ICD-10-CM

## 2020-05-29 ENCOUNTER — Other Ambulatory Visit: Payer: Self-pay | Admitting: Gastroenterology

## 2020-05-29 ENCOUNTER — Ambulatory Visit: Payer: BLUE CROSS/BLUE SHIELD | Admitting: Gastroenterology

## 2020-06-03 DIAGNOSIS — R928 Other abnormal and inconclusive findings on diagnostic imaging of breast: Secondary | ICD-10-CM | POA: Diagnosis not present

## 2020-06-03 DIAGNOSIS — Z1231 Encounter for screening mammogram for malignant neoplasm of breast: Secondary | ICD-10-CM | POA: Diagnosis not present

## 2020-06-09 ENCOUNTER — Telehealth: Payer: Self-pay | Admitting: Gastroenterology

## 2020-06-09 DIAGNOSIS — K50919 Crohn's disease, unspecified, with unspecified complications: Secondary | ICD-10-CM

## 2020-06-09 MED ORDER — HUMIRA (2 PEN) 40 MG/0.4ML ~~LOC~~ AJKT: AUTO-INJECTOR | SUBCUTANEOUS | 2 refills | Status: DC

## 2020-06-09 NOTE — Telephone Encounter (Signed)
I have sent prescription to patients pharmacy.

## 2020-06-13 NOTE — Telephone Encounter (Signed)
Rep called from pharmacy stating Humira was denied call [phone] for direction

## 2020-06-17 ENCOUNTER — Telehealth: Payer: Self-pay | Admitting: Gastroenterology

## 2020-06-17 ENCOUNTER — Encounter: Payer: Self-pay | Admitting: Gastroenterology

## 2020-06-17 NOTE — Telephone Encounter (Signed)
Spoke to patient to let her know that we are reaching out to her insurance company regarding her medication denial. Patient voiced understanding.

## 2020-06-17 NOTE — Telephone Encounter (Signed)
Per [facility] pa was denied and a appeal was faxed and waiting on response

## 2020-06-19 DIAGNOSIS — N6311 Unspecified lump in the right breast, upper outer quadrant: Secondary | ICD-10-CM | POA: Diagnosis not present

## 2020-06-19 DIAGNOSIS — R928 Other abnormal and inconclusive findings on diagnostic imaging of breast: Secondary | ICD-10-CM | POA: Diagnosis not present

## 2020-06-24 NOTE — Telephone Encounter (Signed)
Received a call from Dr. Marlowe Sax GI provider to wanting to do a peer to peer with Dr. Lyndel Safe in reference to Humira high dosage. He asked that you text him at [phone] to discuss further

## 2020-06-25 NOTE — Telephone Encounter (Signed)
Called the number to Dr Marlowe Sax GI provider and left a message to call our office to set up a date and time for a peer to peer with Dr Lyndel Safe. Spoke with [facility] who states the PA is going through [employer] who reports the appeal for patient to receive Humira  40 mg weekly is due by 4 pm today. Patient is due for her next injection on 06/26/20. The [employer] external review team does not have a direct number to speak with a representative to try and push the process faster or to  add additional information to help with the approval. Will wait for a response on approval for Humira. Patient has been notified of this and voiced understanding.

## 2020-06-25 NOTE — Telephone Encounter (Signed)
Not so sure what is there for peer to peer review She has Crohn's disease and will need [place] [employer], Can you please set it up for next week when I am in the clinic RG

## 2020-06-26 NOTE — Telephone Encounter (Signed)
Patient called states she received a call from [place] with a denial on the Humira medication again so she is asking what's next

## 2020-06-26 NOTE — Telephone Encounter (Signed)
Called Dr Marlowe Sax and left 2 voicemail's on his phone to contact our office regarding patients Humira denial. No return call back. Dr Lyndel Safe see bee below messages and advise.

## 2020-06-26 NOTE — Telephone Encounter (Signed)
Catherine Nicholson from [place] is requesting a call back from a nurse to coordinate a peer to peer review on the pt's humira denial, she is trying to schedule this peer to peer with Dr Lyndel Safe  CB [phone]

## 2020-06-26 NOTE — Telephone Encounter (Signed)
Spoke with a representative from [place] to schedule a peer to peer review on patients Humira denial. She told me to call back on Monday to schedule that appointment. Will contact them Monday 06/30/20. Will try to get this set up for Tuesday 07/01/20 as Dr Lyndel Safe will be in the office. Patient notified of this information.

## 2020-06-26 NOTE — Telephone Encounter (Signed)
Pt is wanting to inform the nurse the option for obtaining the Humira shot is not possible bc they cost around $10,000.

## 2020-06-27 ENCOUNTER — Telehealth: Payer: Self-pay | Admitting: Gastroenterology

## 2020-06-29 NOTE — Telephone Encounter (Signed)
Discussed with the reviewer Per protocol, she has to be seen in office once a year.  Claiborne Billings, Can you please work her into my clinic in a week or 2 RG

## 2020-06-30 NOTE — Telephone Encounter (Signed)
Patient is scheduled for 07/01/20

## 2020-07-03 ENCOUNTER — Ambulatory Visit (INDEPENDENT_AMBULATORY_CARE_PROVIDER_SITE_OTHER): Payer: BC Managed Care – PPO | Admitting: Gastroenterology

## 2020-07-03 ENCOUNTER — Encounter: Payer: Self-pay | Admitting: Gastroenterology

## 2020-07-03 VITALS — BP 124/78 | HR 103 | Ht 66.0 in | Wt 191.4 lb

## 2020-07-03 DIAGNOSIS — K50919 Crohn's disease, unspecified, with unspecified complications: Secondary | ICD-10-CM | POA: Diagnosis not present

## 2020-07-03 NOTE — Progress Notes (Signed)
Chief Complaint: FU  Referring Provider:  Street, Sharon Mt, *      ASSESSMENT AND PLAN;   #1. Crohn's colitis with pseudopolyposis- Dx 01/2012 by Dr Deatra Ina on colon with Nl TI. Steroid dependent with cushingoid features, started Humira May 2014, dose increased to every week due to low trough levels, neg Ab 05/2017 with good results, last Dexa scan Dr Venetia Maxon- neg, s/p up-to-date on vaccines (annual flu shot, shingles, pneumovax). Last colon 01/2019-multiple pseudopolyps transverse colon, pancolonic involvement with normal TI, left colonic diverticulosis. Also,  being followed by Dr. Renee Harder IBD clinic at [facility].  Plan - Prednisone down to 71m po qd gradually once Humira is restarted. Continue gradual taper. - Continue Humira 470mevery week.  Please get it approved. - Mesalamine 50042mid to continue - Check CBC, CMP, CRP, TB gold, HBsAg, anti-HBsAb titer - Colon Jan/feb 2022 with 2 day prep miralax.  Discussed risks and benefits.  Dr. BloRenee Harders recommended annual colons d/t multiple pseudopolyps and FH of colon cancer.   - FU in 1 year.   #2.  Family history of cancer (father at the age of 68)35 HPI:    AleAymar Whitfill a [age]o. female  For follow-up visit Seen as emergency workin INS -would not approve Humira until seen for follow-up.  Have done peer-to-peer review. Missed 2nd shot and started having more symptoms of joint pains and diarrhea.  She has increased her prednisone from 10 to 15 mg/day.  Still having problems.  No melena or hematochezia.  Some abdominal discomfort but no definite pain.  Was doing very well with Humira every week.  Dose escalation was based upon the trough levels.  Prior to increasing her level was 2.4 ug/ml with undetectable antibodies.  After increasing Humira, her trough level has increased to 5.8 ug/ml (still < 8). But, with significant relief.  She wanted to continue on mesalamine as well.  Willing to get colonoscopy done next  year.  Last colonoscopy 01/22/2019: -Preparation of the colon was fair. -Crohn's disease with scarring in the ascending colon. -Multiple polyps in the transverse colon (s/p post polypectomy x3, likely pseudopolyps) Bx-inflammatory polyps. -Left colonic diverticulosis. -Family history of colon cancer.  Past Medical History:  Diagnosis Date  . Allergy   . Anemia   . Anxiety   . Cataract   . Clotting disorder ([facility] . [place]'s colitis ([facility]-2013   Colonoscopy  . Crohn's disease ([facility] . Depression   . Diabetes mellitus without complication ([facility] . GERD (gastroesophageal reflux disease)   . Glaucoma   . Hemorrhoids 08-2011   Colonoscopy   . Hyperlipidemia   . Hypertension   . IBS (irritable bowel syndrome)   . PID (pelvic inflammatory disease) 1989   with HSG  . Regional enteritis of unspecified site 10/25/2011    Past Surgical History:  Procedure Laterality Date  . BREAST BIOPSY  06/2002   Fibroadenoma  . CAROTID ARTERY - SUBCLAVIAN ARTERY BYPASS GRAFT  2004  . CESAREAN SECTION  1991  . EYE SURGERY     bi-lat lens transplant  . NASAL SINUS SURGERY  1998   Reconstruction   . TONSILLECTOMY  1992  . TUBAL LIGATION      Family History  Problem Relation Age of Onset  . Heart disease Father   . Hypertension Father   . Heart attack Father 46 35 Colon cancer Mother   . Esophageal cancer Neg Hx   .  Stomach cancer Neg Hx   . Rectal cancer Neg Hx     Social History   Tobacco Use  . Smoking status: Former Smoker    Years: 30.00    Types: Cigarettes    Quit date: 07/27/2010    Years since quitting: 9.9  . Smokeless tobacco: Never Used  Vaping Use  . Vaping Use: Never used  Substance Use Topics  . Alcohol use: No  . Drug use: No    Current Outpatient Medications  Medication Sig Dispense Refill  . Adalimumab (HUMIRA PEN) 40 MG/0.4ML PNKT Inject one pen under the skin every 7 days 4 each 2  . amitriptyline (ELAVIL) 50 MG tablet Take 50 mg by mouth at bedtime.     Marland Kitchen aspirin 325 MG tablet Take 325 mg by mouth daily.    Marland Kitchen azelastine (ASTELIN) 0.1 % nasal spray INHALE 1 SPRAY 2 TIMES PER DAY IN EACH NOSTRIL AS NEEDED FOR NASAL CONGESTION    . furosemide (LASIX) 20 MG tablet Take 20 mg by mouth daily.     Marland Kitchen glimepiride (AMARYL) 2 MG tablet Take by mouth daily with breakfast.     . latanoprost (XALATAN) 0.005 % ophthalmic solution 1 drop at bedtime.    Marland Kitchen lisinopril (PRINIVIL,ZESTRIL) 40 MG tablet Take 40 mg by mouth daily.    . Multiple Vitamins-Minerals (MULTIVITAMIN PO) Take 1 tablet by mouth daily.     Marland Kitchen PENTASA 500 MG CR capsule TAKE 1 CAPSULE BY MOUTH THREE TIMES A DAY 270 capsule 3  . predniSONE (DELTASONE) 10 MG tablet Take 10 mg by mouth daily.    . predniSONE (DELTASONE) 5 MG tablet Take 5 mg by mouth daily with breakfast.    . rosuvastatin (CRESTOR) 40 MG tablet Take 40 mg by mouth daily.     No current facility-administered medications for this visit.    No Known Allergies  Review of Systems:  neg    Physical Exam:    BP 124/78   Pulse (!) 103   Ht 5' 6"  (1.676 m)   Wt 191 lb 6 oz (86.8 kg)   LMP 03/14/2014   BMI 30.89 kg/m  Filed Weights   07/03/20 0836  Weight: 191 lb 6 oz (86.8 kg)   Constitutional:  Well-developed, in no acute distress. Psychiatric: Normal mood and affect. Behavior is normal. HEENT: Pupils normal.  Conjunctivae are normal. No scleral icterus. Moon facies.  Cushingoid features Neck supple.  Cardiovascular: Normal rate, regular rhythm. No edema Pulmonary/chest: Effort normal and breath sounds normal. No wheezing, rales or rhonchi. Abdominal: Soft, nondistended. Nontender. Bowel sounds active throughout. There are no masses palpable. No hepatomegaly. Rectal:  defered Neurological: Alert and oriented to person place and time. Skin: Skin is warm and dry. No rashes noted.    Carmell Austria, MD 07/03/2020, 8:55 AM  Cc: Street, Sharon Mt, *

## 2020-07-03 NOTE — Patient Instructions (Signed)
If you are age 61 or older, your body mass index should be between 23-30. Your Body mass index is 30.89 kg/m. If this is out of the aforementioned range listed, please consider follow up with your Primary Care Provider.  If you are age 76 or younger, your body mass index should be between 19-25. Your Body mass index is 30.89 kg/m. If this is out of the aformentioned range listed, please consider follow up with your Primary Care Provider.   Please go to the lab at [facility] ([place] You will need to go to level "B", you do not need an appointment for this. Hours available are 7:30 am - 4:30 pm.   It has been recommended to you by your physician that you have a(n) Colonoscpy completed. Per your request, we did not schedule the procedure(s) today. Please contact our office at [phone] should you decide to have the procedure completed. You will be scheduled for a pre-visit and procedure at that time. Please ask to speak to Loretha Stapler CMA.    Thank you,  Dr. Jackquline Denmark

## 2020-07-04 ENCOUNTER — Telehealth: Payer: Self-pay | Admitting: Gastroenterology

## 2020-07-04 ENCOUNTER — Other Ambulatory Visit (INDEPENDENT_AMBULATORY_CARE_PROVIDER_SITE_OTHER): Payer: BC Managed Care – PPO

## 2020-07-04 DIAGNOSIS — K50919 Crohn's disease, unspecified, with unspecified complications: Secondary | ICD-10-CM | POA: Diagnosis not present

## 2020-07-04 LAB — CBC WITH DIFFERENTIAL/PLATELET
Basophils Absolute: 0.1 10*3/uL (ref 0.0–0.1)
Basophils Relative: 0.9 % (ref 0.0–3.0)
Eosinophils Absolute: 0.1 10*3/uL (ref 0.0–0.7)
Eosinophils Relative: 2.2 % (ref 0.0–5.0)
HCT: 37.9 % (ref 36.0–46.0)
Hemoglobin: 12.5 g/dL (ref 12.0–15.0)
Lymphocytes Relative: 17 % (ref 12.0–46.0)
Lymphs Abs: 1.1 10*3/uL (ref 0.7–4.0)
MCHC: 32.9 g/dL (ref 30.0–36.0)
MCV: 89.7 fl (ref 78.0–100.0)
Monocytes Absolute: 0.6 10*3/uL (ref 0.1–1.0)
Monocytes Relative: 9.4 % (ref 3.0–12.0)
Neutro Abs: 4.5 10*3/uL (ref 1.4–7.7)
Neutrophils Relative %: 70.5 % (ref 43.0–77.0)
Platelets: 482 10*3/uL — ABNORMAL HIGH (ref 150.0–400.0)
RBC: 4.22 Mil/uL (ref 3.87–5.11)
RDW: 15.3 % (ref 11.5–15.5)
WBC: 6.3 10*3/uL (ref 4.0–10.5)

## 2020-07-04 LAB — COMPREHENSIVE METABOLIC PANEL
ALT: 19 U/L (ref 0–35)
AST: 14 U/L (ref 0–37)
Albumin: 4.2 g/dL (ref 3.5–5.2)
Alkaline Phosphatase: 42 U/L (ref 39–117)
BUN: 10 mg/dL (ref 6–23)
CO2: 26 mEq/L (ref 19–32)
Calcium: 9.9 mg/dL (ref 8.4–10.5)
Chloride: 99 mEq/L (ref 96–112)
Creatinine, Ser: 0.77 mg/dL (ref 0.40–1.20)
GFR: 83.16 mL/min (ref >60.00)
Glucose, Bld: 129 mg/dL — ABNORMAL HIGH (ref 70–99)
Potassium: 4.1 mEq/L (ref 3.5–5.1)
Sodium: 137 mEq/L (ref 135–145)
Total Bilirubin: 0.3 mg/dL (ref 0.2–1.2)
Total Protein: 7.2 g/dL (ref 6.0–8.3)

## 2020-07-04 LAB — C-REACTIVE PROTEIN: CRP: <1 mg/dL (ref 0.5–20.0)

## 2020-07-04 NOTE — Telephone Encounter (Signed)
LMOM for patient to call back.

## 2020-07-04 NOTE — Telephone Encounter (Signed)
Pt is returning a missed call from the nurse.

## 2020-07-04 NOTE — Telephone Encounter (Signed)
Spoke to patient to inform her that she can start taking her Humira anytime. She will let me know if 07/03/20 office note needs to be faxed over. Dr Lyndel Safe did a peer to peer last week,and was indicated that patient has an annual office visit in order for them to refill her prescription.

## 2020-07-07 LAB — QUANTIFERON-TB GOLD PLUS
Mitogen-NIL: 6.73 IU/mL
NIL: 0.02 IU/mL
QuantiFERON-TB Gold Plus: NEGATIVE
TB1-NIL: 0 IU/mL
TB2-NIL: 0.01 IU/mL

## 2020-07-07 LAB — HEPATITIS B SURFACE ANTIGEN: Hepatitis B Surface Ag: NONREACTIVE

## 2020-07-07 LAB — HEPATITIS B SURFACE ANTIBODY,QUALITATIVE: Hep B S Ab: NONREACTIVE

## 2020-07-07 NOTE — Telephone Encounter (Signed)
The paper work was sent to the pharmacy on 07/04/20 with the word " URGENT" attached

## 2020-07-07 NOTE — Telephone Encounter (Signed)
Pt is wanting to inform the nurse to mark "urgent" the appeal for pt's humira when she sends in the paperwork to the Pharmacy.

## 2020-07-24 ENCOUNTER — Ambulatory Visit: Payer: BC Managed Care – PPO | Admitting: Gastroenterology

## 2020-07-25 ENCOUNTER — Telehealth: Payer: Self-pay | Admitting: Gastroenterology

## 2020-07-25 NOTE — Telephone Encounter (Signed)
Resubmitted PA form to [facility]

## 2020-07-30 ENCOUNTER — Other Ambulatory Visit: Payer: Self-pay | Admitting: Gastroenterology

## 2020-07-30 MED ORDER — HUMIRA (2 PEN) 40 MG/0.4ML ~~LOC~~ AJKT: 40.0000 mg | AUTO-INJECTOR | SUBCUTANEOUS | 6 refills | Status: DC

## 2020-07-30 NOTE — Telephone Encounter (Signed)
No additional notes needed  

## 2020-08-05 ENCOUNTER — Telehealth: Payer: Self-pay | Admitting: Gastroenterology

## 2020-08-05 NOTE — Telephone Encounter (Signed)
Pt is requesting a refill on her prednisone.

## 2020-08-06 MED ORDER — PREDNISONE 10 MG PO TABS: 10.0000 mg | ORAL_TABLET | Freq: Every day | ORAL | 3 refills | Status: AC

## 2020-08-06 NOTE — Telephone Encounter (Signed)
I have sent prescription to patients pharmacy

## 2020-08-07 ENCOUNTER — Telehealth: Payer: Self-pay | Admitting: Gastroenterology

## 2020-08-07 NOTE — Telephone Encounter (Signed)
[facility] is requesting clarification on the directions for the pt's HUMIRA, caller states the pt has usually received directions to inject into the skin every 7 days but the new directions call for every 14 days.

## 2020-08-07 NOTE — Telephone Encounter (Signed)
Spoke to a Software engineer at [employer]. They have discontinued the every 7 day Humira and added Humira every 14 days. Patient has been notified and will call them to schedule a delivery date.

## 2020-08-12 NOTE — Telephone Encounter (Signed)
A PA for Humira has been resubmitted to insurance. PA # [id number]. Patient has been notified.

## 2020-08-12 NOTE — Telephone Encounter (Signed)
Catherine Nicholson from [place] is requesting pre authorization on the pt's Humira.  CB [phone]

## 2020-08-19 ENCOUNTER — Ambulatory Visit (INDEPENDENT_AMBULATORY_CARE_PROVIDER_SITE_OTHER): Payer: BC Managed Care – PPO | Admitting: Gastroenterology

## 2020-08-19 DIAGNOSIS — Z23 Encounter for immunization: Secondary | ICD-10-CM | POA: Diagnosis not present

## 2020-09-02 ENCOUNTER — Ambulatory Visit: Payer: BC Managed Care – PPO | Admitting: Gastroenterology

## 2020-10-23 ENCOUNTER — Ambulatory Visit: Payer: Self-pay | Admitting: Gastroenterology

## 2020-11-21 ENCOUNTER — Other Ambulatory Visit: Payer: Self-pay | Admitting: Gastroenterology

## 2020-11-21 NOTE — Telephone Encounter (Signed)
Does this patient need prednisone script?

## 2020-11-25 ENCOUNTER — Ambulatory Visit: Payer: Self-pay | Admitting: Gastroenterology

## 2020-12-16 ENCOUNTER — Other Ambulatory Visit: Payer: Self-pay

## 2020-12-16 ENCOUNTER — Encounter: Payer: Self-pay | Admitting: Gastroenterology

## 2020-12-16 ENCOUNTER — Ambulatory Visit (INDEPENDENT_AMBULATORY_CARE_PROVIDER_SITE_OTHER): Payer: 59 | Admitting: Gastroenterology

## 2020-12-16 VITALS — BP 132/80 | HR 122 | Ht 66.0 in | Wt 194.5 lb

## 2020-12-16 DIAGNOSIS — K50919 Crohn's disease, unspecified, with unspecified complications: Secondary | ICD-10-CM

## 2020-12-16 NOTE — Progress Notes (Signed)
Chief Complaint: FU  Referring Provider:  Street, Sharon Mt, *      ASSESSMENT AND PLAN;   #1. Crohn's colitis with pseudopolyposis- Dx 01/2012 by Dr Deatra Ina on colon with Nl TI. Steroid dependent with cushingoid features, started Humira May 2014, dose escalation (d/t low trough level) Oct 2018 to 40 Q weekly with good response.  Unfortunately, d/t INS reasons, changed Humira q. q. weekly Jan 2022   last Dexa scan Dr Venetia Maxon- neg, s/p up-to-date on vaccines (annual flu shot, shingles, pneumovax). Last colon 01/2019-multiple pseudopolyps transverse colon, pancolonic involvement with normal TI, left colonic diverticulosis. Also,  being followed by Dr. Renee Harder IBD clinic at [facility].  Plan - Prednisone 49m po qd to continue for now. - Continue Humira 468mQ2 weekly for now. If trough level is low and antibodies are negative, would again request insurance company to approve it every weekly. - Mesalamine 50058mid to continue for now - Check CBC, CMP, CRP - Humira level just before next dose and antibody - Stool studies for GI Pathogen (includes C. Diff) and Calprotectin) - Get blood work from Dr StrMarathon Oil Colon with 2 day prep miralax.  Discussed risks and benefits.  Dr. BloRenee Harders recommended annual colons d/t multiple pseudopolyps and FH of colon cancer.     #2.  FH of colon cancer (F @ age 73)4 HPI:    AleYelina Sarratt a 60 36o. female  For follow-up visit  BMs 2-3/day with Occ nocturnal symptoms ever since she has changed Humira every 2 weeks due to INS reasons.  She also has been having increased joint stiffness requiring increased prednisone from 5 mg to 20 mg every day.  She has appointment with rheumatology on Dec 30, 2020.  Her diarrhea has also gotten worse.  She denies having any melena or hematochezia.  No fever chills or night sweats.  She denies having any weight loss or loss of appetite.  She is due for repeat colonoscopy.  No recent antibiotics or  travel.  No recent COVID.   From previous notes: was doing very well with Humira every week.  Dose escalation was based upon the trough levels.  Prior to increasing her level was 2.4 ug/ml with undetectable antibodies.  After increasing Humira, her trough level has increased to 5.8 ug/ml (still < 8). But, with significant relief.  Last colonoscopy 01/22/2019: -Preparation of the colon was fair. -Crohn's disease with scarring in the ascending colon. -Multiple polyps in the transverse colon (s/p post polypectomy x3, likely pseudopolyps) Bx-inflammatory polyps. -Left colonic diverticulosis. -Family history of colon cancer.  Past Medical History:  Diagnosis Date  . Allergy   . Anemia   . Anxiety   . Cataract   . [place] . [place]   Colonoscopy  . [place]'s disease ([place] . Depression   . Diabetes mellitus without complication ([place] . GERD (gastroesophageal reflux disease)   . Glaucoma   . Hemorrhoids 08-2011   Colonoscopy   . Hyperlipidemia   . Hypertension   . IBS (irritable bowel syndrome)   . PID (pelvic inflammatory disease) 1989   with HSG  . Regional enteritis of unspecified site 10/25/2011    Past Surgical History:  Procedure Laterality Date  . BREAST BIOPSY  06/2002   Fibroadenoma  . CAROTID ARTERY - SUBCLAVIAN ARTERY BYPASS GRAFT  2004  . CESAREAN SECTION  1991  . EYE SURGERY     bi-lat  lens transplant  . NASAL SINUS SURGERY  1998   Reconstruction   . TONSILLECTOMY  1992  . TUBAL LIGATION      Family History  Problem Relation Age of Onset  . Heart disease Father   . Hypertension Father   . Heart attack Father 56  . Colon cancer Mother   . Esophageal cancer Neg Hx   . Stomach cancer Neg Hx   . Rectal cancer Neg Hx     Social History   Tobacco Use  . Smoking status: Former Smoker    Years: 30.00    Types: Cigarettes    Quit date: 07/27/2010    Years since quitting: 10.3  . Smokeless tobacco: Never Used  Vaping  Use  . Vaping Use: Never used  Substance Use Topics  . Alcohol use: No  . Drug use: No    Current Outpatient Medications  Medication Sig Dispense Refill  . Adalimumab (HUMIRA PEN) 40 MG/0.4ML PNKT Inject 40 mg into the skin every 14 (fourteen) days. 2 each 6  . amitriptyline (ELAVIL) 50 MG tablet Take 50 mg by mouth at bedtime.    Marland Kitchen aspirin 325 MG tablet Take 325 mg by mouth daily.    Marland Kitchen azelastine (ASTELIN) 0.1 % nasal spray INHALE 1 SPRAY 2 TIMES PER DAY IN EACH NOSTRIL AS NEEDED FOR NASAL CONGESTION    . furosemide (LASIX) 20 MG tablet Take 20 mg by mouth daily.     Marland Kitchen glimepiride (AMARYL) 2 MG tablet Take by mouth daily with breakfast.     . latanoprost (XALATAN) 0.005 % ophthalmic solution 1 drop at bedtime.    Marland Kitchen lisinopril (PRINIVIL,ZESTRIL) 40 MG tablet Take 40 mg by mouth daily.    . Multiple Vitamins-Minerals (MULTIVITAMIN PO) Take 1 tablet by mouth daily.     Marland Kitchen PENTASA 500 MG CR capsule TAKE 1 CAPSULE BY MOUTH THREE TIMES A DAY 270 capsule 3  . predniSONE (DELTASONE) 10 MG tablet Take 1 tablet (10 mg total) by mouth daily. 30 tablet 3  . rosuvastatin (CRESTOR) 40 MG tablet Take 20 mg by mouth daily.     No current facility-administered medications for this visit.    No Known Allergies  Review of Systems:  neg    Physical Exam:    BP 132/80 (BP Location: Left Arm, Patient Position: Sitting, Cuff Size: Normal)   Pulse (!) 122   Ht 5' 6"  (1.676 m)   Wt 194 lb 8 oz (88.2 kg)   LMP 03/14/2014   BMI 31.39 kg/m  Filed Weights   12/16/20 1544  Weight: 194 lb 8 oz (88.2 kg)   Constitutional:  Well-developed, in no acute distress. Psychiatric: Normal mood and affect. Behavior is normal. HEENT: Pupils normal.  Conjunctivae are normal. No scleral icterus. Moon facies.  Cushingoid features Neck supple.  Cardiovascular: Normal rate, regular rhythm. No edema Pulmonary/chest: Effort normal and breath sounds normal. No wheezing, rales or rhonchi. Abdominal: Soft,  nondistended. Nontender. Bowel sounds active throughout. There are no masses palpable. No hepatomegaly. Rectal:  defered Neurological: Alert and oriented to person place and time. Skin: Skin is warm and dry. No rashes noted.    Carmell Austria, MD 12/16/2020, 3:54 PM  Cc: Street, Sharon Mt, *

## 2020-12-16 NOTE — Patient Instructions (Addendum)
If you are age 62 or older, your body mass index should be between 23-30. Your Body mass index is 31.39 kg/m. If this is out of the aforementioned range listed, please consider follow up with your Primary Care Provider.  If you are age 68 or younger, your body mass index should be between 19-25. Your Body mass index is 31.39 kg/m. If this is out of the aformentioned range listed, please consider follow up with your Primary Care Provider.   It has been recommended to you by your physician that you have a colonoscopy completed. Per your request, we did not schedule the procedure(s) today. Please contact our office at [phone] should you decide to have the procedure completed. You will be scheduled for a pre-visit and procedure at that time.  Please have your lab work drawn for your humira on your next injection day (Thursday)  before your injection time  Please go to the lab on the 2nd floor [address] before you leave the office today.   Follow up in 1 year. Please call to schedule this.  Thank you,  Dr. Jackquline Denmark

## 2020-12-18 ENCOUNTER — Other Ambulatory Visit (INDEPENDENT_AMBULATORY_CARE_PROVIDER_SITE_OTHER): Payer: 59

## 2020-12-18 ENCOUNTER — Other Ambulatory Visit: Payer: Self-pay

## 2020-12-18 DIAGNOSIS — K50919 Crohn's disease, unspecified, with unspecified complications: Secondary | ICD-10-CM

## 2020-12-19 ENCOUNTER — Other Ambulatory Visit: Payer: 59

## 2020-12-19 LAB — COMPREHENSIVE METABOLIC PANEL
ALT: 21 U/L (ref 0–35)
AST: 14 U/L (ref 0–37)
Albumin: 4.3 g/dL (ref 3.5–5.2)
Alkaline Phosphatase: 42 U/L (ref 39–117)
BUN: 10 mg/dL (ref 6–23)
CO2: 25 mEq/L (ref 19–32)
Calcium: 10.1 mg/dL (ref 8.4–10.5)
Chloride: 103 mEq/L (ref 96–112)
Creatinine, Ser: 0.64 mg/dL (ref 0.40–1.20)
GFR: 94.97 mL/min (ref >60.00)
Glucose, Bld: 120 mg/dL — ABNORMAL HIGH (ref 70–99)
Potassium: 4.2 mEq/L (ref 3.5–5.1)
Sodium: 139 mEq/L (ref 135–145)
Total Bilirubin: 0.3 mg/dL (ref 0.2–1.2)
Total Protein: 6.7 g/dL (ref 6.0–8.3)

## 2020-12-19 LAB — CBC WITH DIFFERENTIAL/PLATELET
Basophils Absolute: 0 10*3/uL (ref 0.0–0.2)
Basos: 0 %
EOS (ABSOLUTE): 0 10*3/uL (ref 0.0–0.4)
Eos: 0 %
Hematocrit: 31.8 % — ABNORMAL LOW (ref 34.0–46.6)
Hemoglobin: 10.4 g/dL — ABNORMAL LOW (ref 11.1–15.9)
Immature Grans (Abs): 0.1 10*3/uL (ref 0.0–0.1)
Immature Granulocytes: 1 %
Lymphocytes Absolute: 0.9 10*3/uL (ref 0.7–3.1)
Lymphs: 16 %
MCH: 29 pg (ref 26.6–33.0)
MCHC: 32.7 g/dL (ref 31.5–35.7)
MCV: 89 fL (ref 79–97)
Monocytes Absolute: 0.5 10*3/uL (ref 0.1–0.9)
Monocytes: 8 %
Neutrophils Absolute: 4.2 10*3/uL (ref 1.4–7.0)
Neutrophils: 75 %
Platelets: 508 10*3/uL — ABNORMAL HIGH (ref 150–450)
RBC: 3.59 x10E6/uL — ABNORMAL LOW (ref 3.77–5.28)
RDW: 16.1 % — ABNORMAL HIGH (ref 11.7–15.4)
WBC: 5.7 10*3/uL (ref 3.4–10.8)

## 2020-12-19 LAB — HIGH SENSITIVITY CRP: CRP, High Sensitivity: 10.16 mg/L — ABNORMAL HIGH (ref 0.00–3.00)

## 2020-12-26 ENCOUNTER — Telehealth: Payer: Self-pay | Admitting: Gastroenterology

## 2020-12-26 NOTE — Telephone Encounter (Signed)
Pt called asking about her lab results. Please give her a call. Thank you

## 2020-12-26 NOTE — Telephone Encounter (Signed)
Pt calling for lab results,. Please advise.

## 2020-12-27 LAB — ADALIMUMAB+AB (SERIAL MONITOR)
Adalimumab Drug Level: 2.7 ug/mL
Anti-Adalimumab Antibody: <25 ng/mL

## 2020-12-28 NOTE — Progress Notes (Signed)
ADA level low (2.7). Need higher level around 7-8 Anti- ADA: not detected/very low  Plan: Increase Humira 46m SQ Q weekly for now Rpt ADA level and antibody in 12 weeks.

## 2020-12-29 ENCOUNTER — Other Ambulatory Visit: Payer: Self-pay

## 2020-12-29 DIAGNOSIS — K50919 Crohn's disease, unspecified, with unspecified complications: Secondary | ICD-10-CM

## 2020-12-29 MED ORDER — HUMIRA (2 SYRINGE) 40 MG/0.4ML ~~LOC~~ PSKT: 40.0000 mg | PREFILLED_SYRINGE | SUBCUTANEOUS | 6 refills | Status: DC

## 2021-01-21 ENCOUNTER — Telehealth: Payer: Self-pay | Admitting: Gastroenterology

## 2021-01-27 NOTE — Telephone Encounter (Signed)
Patient calling to get a status on prior authorization for Humira.

## 2021-01-27 NOTE — Telephone Encounter (Signed)
Called and claim was denied for weekly dosing. Records for appeal faxed to [phone].

## 2021-02-03 NOTE — Telephone Encounter (Signed)
Patient called to go over the denial on recent claim.

## 2021-02-03 NOTE — Telephone Encounter (Signed)
Pt states she received her second denial letter today for the weekly humira. She states she saw her Rheumatologist and he is willing to try and send in the prescription for her. Pt states the humira weekly is being denied because for Crohns the weekly dose of humira is not fda approved. Please advise.

## 2021-02-09 ENCOUNTER — Other Ambulatory Visit: Payer: Self-pay

## 2021-02-09 ENCOUNTER — Other Ambulatory Visit: Payer: Self-pay | Admitting: Gastroenterology

## 2021-02-09 NOTE — Telephone Encounter (Signed)
Thanks I think Dr. Lyndel Safe may have to do an appeal due to the requested weekly dosing. I sent him a note also stating that her rheumatologist is also willing to prescribe it to see if they could get approved but he has not replied.

## 2021-02-09 NOTE — Telephone Encounter (Signed)
Patient said that Pentasa wouldn't be covered as her formulary as of July 1st. She said she will e-mail me the list of medications instead of telling me what is her covered mediations. Hopefully ill get that list soon

## 2021-02-09 NOTE — Telephone Encounter (Signed)
Pt calling about Humira and now Pentasa. Not sure if Dr. Lyndel Safe has done anything regarding the Humira yet or not.

## 2021-02-09 NOTE — Telephone Encounter (Signed)
I know you have sent her lab work for that and I guess it didn't work but ill ask and ill see about the humira and the pentasa. I ahvent seen anything requiring a PA but ill start one

## 2021-02-09 NOTE — Telephone Encounter (Signed)
Inbound call from patient. Following up abpout her Humira medication and also to inform that Pentasa is no longer covered by insurance as well.

## 2021-02-11 ENCOUNTER — Encounter: Payer: Self-pay | Admitting: General Surgery

## 2021-02-12 NOTE — Telephone Encounter (Signed)
Appeal letter sent

## 2021-02-19 MED ORDER — HUMIRA (2 SYRINGE) 40 MG/0.4ML ~~LOC~~ PSKT: 40.0000 mg | PREFILLED_SYRINGE | SUBCUTANEOUS | 6 refills | Status: DC

## 2021-02-19 NOTE — Telephone Encounter (Signed)
[facility] Patient said it should be coming July 6th

## 2021-02-19 NOTE — Addendum Note (Signed)
Addended by: Curlene Labrum E on: 02/19/2021 03:23 PM   Modules accepted: Orders

## 2021-02-20 ENCOUNTER — Telehealth: Payer: Self-pay | Admitting: Gastroenterology

## 2021-02-20 MED ORDER — MESALAMINE ER 500 MG PO CPCR: 500.0000 mg | ORAL_CAPSULE | Freq: Three times a day (TID) | ORAL | 3 refills | Status: DC

## 2021-02-20 NOTE — Telephone Encounter (Signed)
Lets just do generic mesalamine (dose as per last note) x 3 months.  4 refills RG

## 2021-02-20 NOTE — Telephone Encounter (Signed)
Patient called to follow up on a refill\coverage  request for Prednisone

## 2021-02-20 NOTE — Telephone Encounter (Signed)
It went through at the pharmacy. Patient will get medication Tuesday or wednesday

## 2021-02-20 NOTE — Telephone Encounter (Addendum)
Pentasa isn't covered on patient's insurance anymore. Is there alternative for this patient that she can use?  I sent the generic version in and waiting to hear back [facility] but I called [facility] [phone] and spoke to a Jameshia B and she said generic mesalamine is covered for 50 dollars for a 270/90 supply and generic sulfasalazine. Brand Pentasa isn't though.

## 2021-03-16 ENCOUNTER — Telehealth: Payer: Self-pay

## 2021-03-16 ENCOUNTER — Other Ambulatory Visit: Payer: Self-pay

## 2021-03-16 NOTE — Telephone Encounter (Signed)
Patient returned your call please call her back.

## 2021-03-16 NOTE — Telephone Encounter (Signed)
Pt called back at 16:53.  PV and colonoscopy were rescheduled.maw

## 2021-03-16 NOTE — Telephone Encounter (Signed)
Pt was scheduled for a telephone Pre Visit 03/16/21 at 8:30 am.  I phoned pt multiple times and her cell #[phone] rings busy.  I called her home number and left a message for pt to call us back today before 5:00 pm or we will have to cancel her colonoscopy scheduled for 03/30/21 @ 9:30 with Dr. Lyndel Safe.

## 2021-03-26 ENCOUNTER — Telehealth: Payer: Self-pay

## 2021-03-26 NOTE — Telephone Encounter (Signed)
Called the patient. No answer. Left her a message. It is time for the recheck of the Humira level and atb. Order is in the lab. Come right before the injection is due and have labs drawn. We will contact her with the results.

## 2021-03-30 ENCOUNTER — Encounter: Payer: 59 | Admitting: Gastroenterology

## 2021-04-23 ENCOUNTER — Ambulatory Visit (INDEPENDENT_AMBULATORY_CARE_PROVIDER_SITE_OTHER): Payer: 59 | Admitting: Gastroenterology

## 2021-04-23 ENCOUNTER — Encounter: Payer: 59 | Admitting: Gastroenterology

## 2021-04-23 ENCOUNTER — Other Ambulatory Visit: Payer: Self-pay

## 2021-04-23 ENCOUNTER — Other Ambulatory Visit (INDEPENDENT_AMBULATORY_CARE_PROVIDER_SITE_OTHER): Payer: 59

## 2021-04-23 DIAGNOSIS — K50919 Crohn's disease, unspecified, with unspecified complications: Secondary | ICD-10-CM

## 2021-04-23 DIAGNOSIS — Z23 Encounter for immunization: Secondary | ICD-10-CM

## 2021-04-23 NOTE — Progress Notes (Deleted)
Hep # 3 done today

## 2021-05-01 ENCOUNTER — Telehealth: Payer: Self-pay | Admitting: Gastroenterology

## 2021-05-01 NOTE — Telephone Encounter (Signed)
Spoke with patient, made patient aware that MD will review results and once done I would call and inform her. Patient stated understanding

## 2021-05-03 LAB — SERIAL MONITORING

## 2021-05-03 NOTE — Progress Notes (Signed)
For HepB

## 2021-05-04 LAB — ADALIMUMAB+AB (SERIAL MONITOR)
Adalimumab Drug Level: 8.2 ug/mL
Anti-Adalimumab Antibody: <25 ng/mL

## 2021-05-07 ENCOUNTER — Other Ambulatory Visit: Payer: Self-pay

## 2021-05-07 ENCOUNTER — Ambulatory Visit (AMBULATORY_SURGERY_CENTER): Payer: Self-pay

## 2021-05-07 ENCOUNTER — Encounter: Payer: Self-pay | Admitting: Gastroenterology

## 2021-05-07 VITALS — Ht 66.0 in | Wt 190.0 lb

## 2021-05-07 DIAGNOSIS — K50111 Crohn's disease of large intestine with rectal bleeding: Secondary | ICD-10-CM

## 2021-05-07 MED ORDER — PEG-KCL-NACL-NASULF-NA ASC-C 100 G PO SOLR: 1.0000 | Freq: Once | ORAL | 0 refills | Status: AC

## 2021-05-07 NOTE — Progress Notes (Signed)
Denies allergies to eggs or soy products. Denies complication of anesthesia or sedation. Denies use of weight loss medication. Denies use of O2.   Emmi instructions given for colonoscopy.  

## 2021-05-19 ENCOUNTER — Telehealth: Payer: Self-pay | Admitting: Gastroenterology

## 2021-05-19 NOTE — Telephone Encounter (Signed)
Hey Dr. Lyndel Safe,   Patient called to cancel procedure 9/29 due to wanted to speak with her insurance to know exactly how much they will cover. Patient states she will call back to reschedule after she hears back from them.   Thank you

## 2021-05-20 NOTE — Telephone Encounter (Signed)
Thanks for letting me know RG

## 2021-05-21 ENCOUNTER — Encounter: Payer: 59 | Admitting: Gastroenterology

## 2021-07-29 ENCOUNTER — Telehealth: Payer: Self-pay

## 2021-07-29 NOTE — Telephone Encounter (Signed)
PA for Humira 40 mg / 0.4 ml weekly injections approved.  Patient will get her TB screening updated at her PCP appointment on 08/07/21.

## 2021-08-11 ENCOUNTER — Other Ambulatory Visit: Payer: Self-pay | Admitting: Gastroenterology

## 2021-12-12 ENCOUNTER — Other Ambulatory Visit: Payer: Self-pay | Admitting: Gastroenterology

## 2021-12-12 DIAGNOSIS — K50919 Crohn's disease, unspecified, with unspecified complications: Secondary | ICD-10-CM

## 2022-02-01 ENCOUNTER — Telehealth: Payer: Self-pay

## 2022-02-01 NOTE — Telephone Encounter (Signed)
Faxed received from [facility] requesting information for PA for Humira. Documents were faxed to Rx [employer] Fax # [phone]

## 2022-02-14 ENCOUNTER — Telehealth: Payer: Self-pay | Admitting: Pharmacy Technician

## 2022-02-14 ENCOUNTER — Other Ambulatory Visit (HOSPITAL_COMMUNITY): Payer: Self-pay

## 2022-03-03 ENCOUNTER — Other Ambulatory Visit: Payer: Self-pay | Admitting: Gastroenterology

## 2022-03-11 ENCOUNTER — Other Ambulatory Visit: Payer: Self-pay | Admitting: Family Medicine

## 2022-03-11 DIAGNOSIS — I70213 Atherosclerosis of native arteries of extremities with intermittent claudication, bilateral legs: Secondary | ICD-10-CM

## 2022-03-12 ENCOUNTER — Telehealth: Payer: Self-pay | Admitting: Pharmacy Technician

## 2022-03-12 NOTE — Telephone Encounter (Signed)
Patient Advocate Encounter  Received notification from [place] that prior authorization for HUMIRA 40MG/0.4ML is required.   PA submitted on 7.21.23 Key [id number] Status is pending    Luciano Cutter, CPhT Patient Advocate Phone: [phone]

## 2022-03-15 ENCOUNTER — Other Ambulatory Visit (HOSPITAL_COMMUNITY): Payer: Self-pay

## 2022-03-15 NOTE — Telephone Encounter (Signed)
Patient Advocate Encounter  Prior Authorization for HUMIRA 40MG/0.4ML has been approved.    PA# PA Case ID: [id number] Effective dates: 7.22.23 through 7.22.24  Kendle Erker B. CPhT P: [phone] F: [phone]   The prescription drug plan requires that [facility]. If you have not done so already, a prescription can be faxed to [phone]

## 2022-03-15 NOTE — Telephone Encounter (Signed)
Pt was notified that PA has been approved:  [facility] given to pt Pt verbalized understanding with all questions answered.

## 2022-03-18 ENCOUNTER — Ambulatory Visit
Admission: RE | Admit: 2022-03-18 | Discharge: 2022-03-18 | Disposition: A | Discharge status: Home / Self Care | Payer: 59 | Source: Ambulatory Visit | Attending: Family Medicine | Admitting: Family Medicine

## 2022-03-18 DIAGNOSIS — I70213 Atherosclerosis of native arteries of extremities with intermittent claudication, bilateral legs: Secondary | ICD-10-CM

## 2022-04-06 ENCOUNTER — Other Ambulatory Visit: Payer: Self-pay | Admitting: Gastroenterology

## 2022-04-08 NOTE — Telephone Encounter (Signed)
Patient Advocate Encounter  Prior Authorization for HUMIRA 40MG has been approved.    PA#  [id number]  Effective dates: 7.22.23 through 7.22.24  Christeena Krogh B. CPhT P: [phone] F: [phone]

## 2022-06-22 DIAGNOSIS — R001 Bradycardia, unspecified: Secondary | ICD-10-CM | POA: Diagnosis not present

## 2022-06-22 DIAGNOSIS — I4510 Unspecified right bundle-branch block: Secondary | ICD-10-CM | POA: Diagnosis not present

## 2022-06-22 DIAGNOSIS — I469 Cardiac arrest, cause unspecified: Secondary | ICD-10-CM | POA: Diagnosis not present

## 2022-06-22 DIAGNOSIS — J9601 Acute respiratory failure with hypoxia: Secondary | ICD-10-CM | POA: Diagnosis not present

## 2022-06-22 DIAGNOSIS — R579 Shock, unspecified: Secondary | ICD-10-CM | POA: Diagnosis not present

## 2022-06-23 DIAGNOSIS — J9601 Acute respiratory failure with hypoxia: Secondary | ICD-10-CM

## 2022-06-23 DIAGNOSIS — R579 Shock, unspecified: Secondary | ICD-10-CM

## 2022-06-23 DIAGNOSIS — I4510 Unspecified right bundle-branch block: Secondary | ICD-10-CM

## 2022-06-23 DIAGNOSIS — R001 Bradycardia, unspecified: Secondary | ICD-10-CM

## 2022-07-23 DEATH — deceased

## 2023-03-09 ENCOUNTER — Other Ambulatory Visit (HOSPITAL_COMMUNITY): Payer: Self-pay
